# Patient Record
Sex: Male | Born: 1980 | Race: White | Hispanic: No | Marital: Married | Smoking: Never smoker
Health system: Southern US, Community
[De-identification: ages and names within clinical notes are randomized; demographics above are authoritative.]

## PROBLEM LIST (undated history)

## (undated) DIAGNOSIS — R002 Palpitations: Secondary | ICD-10-CM

## (undated) DIAGNOSIS — I10 Essential (primary) hypertension: Secondary | ICD-10-CM

## (undated) DIAGNOSIS — R42 Dizziness and giddiness: Secondary | ICD-10-CM

## (undated) HISTORY — PX: OTHER SURGICAL HISTORY: SHX169

## (undated) HISTORY — DX: Dizziness and giddiness: R42

## (undated) HISTORY — DX: Palpitations: R00.2

---
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
## 2009-07-29 ENCOUNTER — Emergency Department (HOSPITAL_COMMUNITY): Admission: EM | Admit: 2009-07-29 | Discharge: 2009-07-29 | Payer: Self-pay | Admitting: Emergency Medicine

## 2009-10-05 ENCOUNTER — Emergency Department (HOSPITAL_COMMUNITY): Admission: EM | Admit: 2009-10-05 | Discharge: 2009-10-05 | Payer: Self-pay | Admitting: Emergency Medicine

## 2010-07-14 LAB — POCT I-STAT, CHEM 8
BUN: 12 mg/dL (ref 6–23)
Calcium, Ion: 1.09 mmol/L — ABNORMAL LOW (ref 1.12–1.32)
Chloride: 106 mEq/L (ref 96–112)
Creatinine, Ser: 1 mg/dL (ref 0.4–1.5)
Glucose, Bld: 95 mg/dL (ref 70–99)
HCT: 44 % (ref 39.0–52.0)
Hemoglobin: 15 g/dL (ref 13.0–17.0)
Potassium: 4 mEq/L (ref 3.5–5.1)
Sodium: 139 mEq/L (ref 135–145)
TCO2: 24 mmol/L (ref 0–100)

## 2011-09-08 ENCOUNTER — Encounter: Payer: Self-pay | Admitting: *Deleted

## 2015-06-11 ENCOUNTER — Emergency Department (HOSPITAL_COMMUNITY)
Admission: EM | Admit: 2015-06-11 | Discharge: 2015-06-11 | Disposition: A | Discharge status: Home / Self Care | Payer: 59 | Attending: Emergency Medicine | Admitting: Emergency Medicine

## 2015-06-11 ENCOUNTER — Encounter (HOSPITAL_COMMUNITY): Payer: Self-pay | Admitting: Emergency Medicine

## 2015-06-11 ENCOUNTER — Emergency Department (HOSPITAL_COMMUNITY): Payer: 59

## 2015-06-11 DIAGNOSIS — R630 Anorexia: Secondary | ICD-10-CM | POA: Diagnosis not present

## 2015-06-11 DIAGNOSIS — R1032 Left lower quadrant pain: Secondary | ICD-10-CM | POA: Diagnosis present

## 2015-06-11 DIAGNOSIS — N200 Calculus of kidney: Secondary | ICD-10-CM

## 2015-06-11 LAB — COMPREHENSIVE METABOLIC PANEL
ALT: 29 U/L (ref 17–63)
AST: 25 U/L (ref 15–41)
Albumin: 4 g/dL (ref 3.5–5.0)
Alkaline Phosphatase: 69 U/L (ref 38–126)
Anion gap: 16 — ABNORMAL HIGH (ref 5–15)
BUN: 15 mg/dL (ref 6–20)
CO2: 21 mmol/L — ABNORMAL LOW (ref 22–32)
Calcium: 9.8 mg/dL (ref 8.9–10.3)
Chloride: 106 mmol/L (ref 101–111)
Creatinine, Ser: 1.12 mg/dL (ref 0.61–1.24)
GFR calc Af Amer: >60 mL/min (ref >60)
GFR calc non Af Amer: >60 mL/min (ref >60)
Glucose, Bld: 109 mg/dL — ABNORMAL HIGH (ref 65–99)
Potassium: 4.1 mmol/L (ref 3.5–5.1)
Sodium: 143 mmol/L (ref 135–145)
Total Bilirubin: 0.7 mg/dL (ref 0.3–1.2)
Total Protein: 7 g/dL (ref 6.5–8.1)

## 2015-06-11 LAB — URINALYSIS, ROUTINE W REFLEX MICROSCOPIC
Bilirubin Urine: NEGATIVE
GLUCOSE, UA: NEGATIVE mg/dL
Hgb urine dipstick: NEGATIVE
Ketones, ur: NEGATIVE mg/dL
LEUKOCYTES UA: NEGATIVE
Nitrite: NEGATIVE
PH: 5 (ref 5.0–8.0)
PROTEIN: NEGATIVE mg/dL
Specific Gravity, Urine: 1.021 (ref 1.005–1.030)

## 2015-06-11 LAB — CBC
HEMATOCRIT: 44.5 % (ref 39.0–52.0)
Hemoglobin: 15 g/dL (ref 13.0–17.0)
MCH: 30.6 pg (ref 26.0–34.0)
MCHC: 33.7 g/dL (ref 30.0–36.0)
MCV: 90.8 fL (ref 78.0–100.0)
PLATELETS: 277 10*3/uL (ref 150–400)
RBC: 4.9 MIL/uL (ref 4.22–5.81)
RDW: 13.7 % (ref 11.5–15.5)
WBC: 10.2 10*3/uL (ref 4.0–10.5)

## 2015-06-11 MED ORDER — ONDANSETRON HCL 4 MG/2ML IJ SOLN
4.0000 mg | Freq: Once | INTRAMUSCULAR | Status: AC
  Administered 2015-06-11: 4 mg via INTRAVENOUS
  Filled 2015-06-11: qty 2

## 2015-06-11 MED ORDER — HYDROCODONE-ACETAMINOPHEN 5-325 MG PO TABS: 1.0000 | ORAL_TABLET | ORAL | Status: DC | PRN

## 2015-06-11 MED ORDER — KETOROLAC TROMETHAMINE 30 MG/ML IJ SOLN
30.0000 mg | Freq: Once | INTRAMUSCULAR | Status: AC
  Administered 2015-06-11: 30 mg via INTRAVENOUS
  Filled 2015-06-11: qty 1

## 2015-06-11 MED ORDER — MORPHINE SULFATE (PF) 4 MG/ML IV SOLN
6.0000 mg | Freq: Once | INTRAVENOUS | Status: AC
  Administered 2015-06-11: 6 mg via INTRAVENOUS
  Filled 2015-06-11: qty 2

## 2015-06-11 MED ORDER — OXYCODONE-ACETAMINOPHEN 5-325 MG PO TABS
1.0000 | ORAL_TABLET | Freq: Once | ORAL | Status: AC
  Administered 2015-06-11: 1 via ORAL

## 2015-06-11 MED ORDER — OXYCODONE-ACETAMINOPHEN 5-325 MG PO TABS
ORAL_TABLET | ORAL | Status: AC
  Filled 2015-06-11: qty 1

## 2015-06-11 NOTE — ED Provider Notes (Signed)
CSN: [id number]     Arrival date & time 06/11/15  0522 History   First MD Initiated Contact with Patient 06/11/15 [phone]     Chief Complaint  Patient presents with  . Abdominal Pain  . Back Pain  . Groin Pain     (Consider location/radiation/quality/duration/timing/severity/associated sxs/prior Treatment) HPI Comments: 35 year old male with no significant medical history presents with left lower abdominal and groin pain since this morning. Radiates the testicle. No history of similar. No family history of kidney stones. Decreased urine output no hematuria noted. No fevers or chills. Decreased appetite and no vomiting. Symptoms intermittent.  Patient is a 35 y.o. male presenting with abdominal pain, back pain, and groin pain. The history is provided by the patient.  Abdominal Pain Associated symptoms: nausea   Associated symptoms: no chest pain, no chills, no dysuria, no fever, no shortness of breath and no vomiting   Back Pain Associated symptoms: abdominal pain   Associated symptoms: no chest pain, no dysuria, no fever and no headaches   Groin Pain Associated symptoms include abdominal pain. Pertinent negatives include no chest pain, no headaches and no shortness of breath.    Past Medical History  Diagnosis Date  . Palpitations   . Dizziness    Past Surgical History  Procedure Laterality Date  . None     Family History  Problem Relation Age of Onset  . Coronary artery disease    . Hypertension     Social History  Substance Use Topics  . Smoking status: Never Smoker   . Smokeless tobacco: None  . Alcohol Use: Yes    Review of Systems  Constitutional: Negative for fever and chills.  HENT: Negative for congestion.   Eyes: Negative for visual disturbance.  Respiratory: Negative for shortness of breath.   Cardiovascular: Negative for chest pain.  Gastrointestinal: Positive for nausea and abdominal pain. Negative for vomiting.  Genitourinary: Positive for flank pain.  Negative for dysuria.  Musculoskeletal: Positive for back pain. Negative for neck pain and neck stiffness.  Skin: Negative for rash.  Neurological: Negative for light-headedness and headaches.      Allergies  Review of patient's allergies indicates no known allergies.  Home Medications   Prior to Admission medications   Medication Sig Start Date End Date Taking? Authorizing Provider  HYDROcodone-acetaminophen (NORCO) 5-325 MG tablet Take 1-2 tablets by mouth every 4 (four) hours as needed. 06/11/15   Blane Ohara, MD   BP 141/98 mmHg  Pulse 78  Temp(Src) 98 F (36.7 C) (Oral)  Resp 16  SpO2 100% Physical Exam  Constitutional: He is oriented to person, place, and time. He appears well-developed and well-nourished.  HENT:  Head: Normocephalic and atraumatic.  Eyes: Conjunctivae are normal. Right eye exhibits no discharge. Left eye exhibits no discharge.  Neck: Normal range of motion. Neck supple. No tracheal deviation present.  Cardiovascular: Normal rate and regular rhythm.   Pulmonary/Chest: Effort normal and breath sounds normal.  Abdominal: Soft. He exhibits no distension. There is tenderness (mild left lower quadrant uncomfortable). There is no guarding.  Genitourinary:  No testicular swelling tenderness or sign of infection no hernia palpated  Musculoskeletal: He exhibits no edema.  Neurological: He is alert and oriented to person, place, and time.  Skin: Skin is warm. No rash noted.  Psychiatric: He has a normal mood and affect.  Nursing note and vitals reviewed.   ED Course  Procedures (including critical care time) Emergency Focused Ultrasound Exam Limited retroperitoneal ultrasound of kidneys  Performed and interpreted by Dr. Jodi Mourning Indication: flank pain Focused abdominal ultrasound with both kidneys imaged in transverse and longitudinal planes in real-time. Interpretation: mild left hydronephrosis visualized.   Images archived electronically  CPT Code:  [phone] (limited retroperitoneal)  Labs Review Labs Reviewed  COMPREHENSIVE METABOLIC PANEL - Abnormal; Notable for the following:    CO2 21 (*)    Glucose, Bld 109 (*)    Anion gap 16 (*)    All other components within normal limits  URINALYSIS, ROUTINE W REFLEX MICROSCOPIC (NOT AT [facility]) - Abnormal; Notable for the following:    APPearance CLOUDY (*)    All other components within normal limits  URINE CULTURE  CBC    Imaging Review Ct Renal Stone Study  06/11/2015  CLINICAL DATA:  Left-sided flank pain for several hours EXAM: CT ABDOMEN AND PELVIS WITHOUT CONTRAST TECHNIQUE: Multidetector CT imaging of the abdomen and pelvis was performed following the standard protocol without IV contrast. COMPARISON:  None. FINDINGS: Lung bases are free of acute infiltrate or sizable effusion. The liver, gallbladder, spleen, adrenal glands and pancreas are normal in their CT appearance. The right kidney is well visualized and within normal limits. The left kidney demonstrates evidence of mild hydronephrosis and hydroureter extending to the left UVJ. At this point there is a 2 mm partially obstructing stone identified. The bladder is well distended. No pelvic mass lesion is seen. The appendix is within normal limits. No acute bony abnormality is noted. IMPRESSION: Left UVJ stone with partial obstruction of the left collecting system and ureter. Electronically Signed   By: Alcide Clever M.D.   On: 06/11/2015 08:13   I have personally reviewed and evaluated these images and lab results as part of my medical decision-making.   EKG Interpretation None      MDM   Final diagnoses:  Kidney stone on left side   Patient presents with clinical concern for kidney stone. Pain not improved after 2 doses of medicine. With first time kidney stone plan for CT scan to confirm details. No signs of testicular pathology on exam.  Patient's pain controlled on reassessment. CT scan revealed small kidney stone discussed  outpatient follow-up and reasons to return Results and differential diagnosis were discussed with the patient/parent/guardian. Xrays were independently reviewed by myself.  Close follow up outpatient was discussed, comfortable with the plan.   Medications  oxyCODONE-acetaminophen (PERCOCET/ROXICET) 5-325 MG per tablet 1 tablet (1 tablet Oral Given 06/11/15 0539)  morphine 4 MG/ML injection 6 mg (6 mg Intravenous Given 06/11/15 0709)  ondansetron (ZOFRAN) injection 4 mg (4 mg Intravenous Given 06/11/15 0710)  ketorolac (TORADOL) 30 MG/ML injection 30 mg (30 mg Intravenous Given 06/11/15 0744)    Filed Vitals:   06/11/15 0531 06/11/15 0630 06/11/15 0700 06/11/15 0730  BP: 144/102 146/97 138/93 141/98  Pulse: 89 77 68 78  Temp: 98 F (36.7 C)     TempSrc: Oral     Resp: 16     SpO2: 96% 99% 98% 100%    Final diagnoses:  Kidney stone on left side       Blane Ohara, MD 06/11/15 0900

## 2015-06-11 NOTE — ED Notes (Signed)
Pt. Reports low abdominal pain , left groin and low back pain onset this morning , denies injury , no emesis or diarrhea, denies hematuria or dysuria .

## 2015-06-11 NOTE — ED Notes (Signed)
Pt states that the last time he urinated was shortly after arriving to the ED today.

## 2015-06-11 NOTE — ED Notes (Signed)
Pt ambulated to and from bathroom without difficulty.

## 2015-06-11 NOTE — Discharge Instructions (Signed)
Take ibuprofen and Tylenol as needed for pain. For severe pain take norco or vicodin however realize they have the potential for addiction and it can make you sleepy and has tylenol in it.  No operating machinery while taking.  If you were given medicines take as directed.  If you are on coumadin or contraceptives realize their levels and effectiveness is altered by many different medicines.  If you have any reaction (rash, tongues swelling, other) to the medicines stop taking and see a physician.    If your blood pressure was elevated in the ER make sure you follow up for management with a primary doctor or return for chest pain, shortness of breath or stroke symptoms.  Please follow up as directed and return to the ER or see a physician for new or worsening symptoms.  Thank you. Filed Vitals:   06/11/15 0531 06/11/15 0630 06/11/15 0700 06/11/15 0730  BP: 144/102 146/97 138/93 141/98  Pulse: 89 77 68 78  Temp: 98 F (36.7 C)     TempSrc: Oral     Resp: 16     SpO2: 96% 99% 98% 100%   Strain urine.

## 2015-06-12 LAB — URINE CULTURE

## 2015-11-24 ENCOUNTER — Encounter (HOSPITAL_COMMUNITY): Payer: Self-pay | Admitting: Emergency Medicine

## 2015-11-24 ENCOUNTER — Emergency Department (HOSPITAL_COMMUNITY)
Admission: EM | Admit: 2015-11-24 | Discharge: 2015-11-25 | Disposition: A | Discharge status: Home / Self Care | Payer: 59 | Attending: Emergency Medicine | Admitting: Emergency Medicine

## 2015-11-24 ENCOUNTER — Emergency Department (HOSPITAL_COMMUNITY): Payer: 59

## 2015-11-24 DIAGNOSIS — W57XXXA Bitten or stung by nonvenomous insect and other nonvenomous arthropods, initial encounter: Secondary | ICD-10-CM | POA: Diagnosis not present

## 2015-11-24 DIAGNOSIS — Y999 Unspecified external cause status: Secondary | ICD-10-CM | POA: Insufficient documentation

## 2015-11-24 DIAGNOSIS — S40862A Insect bite (nonvenomous) of left upper arm, initial encounter: Secondary | ICD-10-CM | POA: Insufficient documentation

## 2015-11-24 DIAGNOSIS — R0789 Other chest pain: Secondary | ICD-10-CM | POA: Diagnosis not present

## 2015-11-24 DIAGNOSIS — Y9289 Other specified places as the place of occurrence of the external cause: Secondary | ICD-10-CM | POA: Diagnosis not present

## 2015-11-24 DIAGNOSIS — M62830 Muscle spasm of back: Secondary | ICD-10-CM | POA: Insufficient documentation

## 2015-11-24 DIAGNOSIS — Y939 Activity, unspecified: Secondary | ICD-10-CM | POA: Insufficient documentation

## 2015-11-24 LAB — BASIC METABOLIC PANEL
ANION GAP: 6 (ref 5–15)
BUN: 12 mg/dL (ref 6–20)
CALCIUM: 9.5 mg/dL (ref 8.9–10.3)
CHLORIDE: 110 mmol/L (ref 101–111)
CO2: 25 mmol/L (ref 22–32)
CREATININE: 1.17 mg/dL (ref 0.61–1.24)
GFR calc non Af Amer: >60 mL/min (ref >60)
Glucose, Bld: 91 mg/dL (ref 65–99)
Potassium: 3.7 mmol/L (ref 3.5–5.1)
SODIUM: 141 mmol/L (ref 135–145)

## 2015-11-24 LAB — CBC
HCT: 44.7 % (ref 39.0–52.0)
HEMOGLOBIN: 14.7 g/dL (ref 13.0–17.0)
MCH: 29.9 pg (ref 26.0–34.0)
MCHC: 32.9 g/dL (ref 30.0–36.0)
MCV: 91 fL (ref 78.0–100.0)
PLATELETS: 272 10*3/uL (ref 150–400)
RBC: 4.91 MIL/uL (ref 4.22–5.81)
RDW: 13.3 % (ref 11.5–15.5)
WBC: 10.4 10*3/uL (ref 4.0–10.5)

## 2015-11-24 LAB — I-STAT TROPONIN, ED: TROPONIN I, POC: 0.01 ng/mL (ref 0.00–0.08)

## 2015-11-24 NOTE — ED Triage Notes (Signed)
Pt. reports central chest pain onset today worse with deep inspiration , denies SOB /bnausea or diaphoresis , pt. added insect bites at left arm while working at yard today .

## 2015-11-25 MED ORDER — NAPROXEN 500 MG PO TABS: 500.0000 mg | ORAL_TABLET | Freq: Two times a day (BID) | ORAL | 0 refills | Status: DC

## 2015-11-25 MED ORDER — CYCLOBENZAPRINE HCL 10 MG PO TABS: 5.0000 mg | ORAL_TABLET | Freq: Two times a day (BID) | ORAL | 0 refills | Status: DC | PRN

## 2015-11-25 MED ORDER — CYCLOBENZAPRINE HCL 10 MG PO TABS
5.0000 mg | ORAL_TABLET | Freq: Once | ORAL | Status: AC
  Administered 2015-11-25: 5 mg via ORAL
  Filled 2015-11-25: qty 1

## 2015-11-25 MED ORDER — KETOROLAC TROMETHAMINE 60 MG/2ML IM SOLN
60.0000 mg | Freq: Once | INTRAMUSCULAR | Status: AC
  Administered 2015-11-25: 60 mg via INTRAMUSCULAR
  Filled 2015-11-25: qty 2

## 2015-11-25 NOTE — ED Provider Notes (Signed)
TIME SEEN: 1: 15 am  CHIEF COMPLAINT: Chest pain and insect bite  HPI: Patient was working on applying mulch and moved a large TV today. While working on the mulch he sustained 5 insect bites to his left arm. A few hours after he developed some pain over his left breast which worsened with deep inspiration. He took his blood pressure and noted it was high at 150/78, his BP is usually normal. He also has worsned pain when pushing himself up to stand up and with certain movements of his arms and back.  ROS: See HPI Constitutional: no fever  Eyes: no drainage  ENT: no runny nose   Cardiovascular:  chest pain  Resp: no SOB  GI: no vomiting GU: no dysuria Integumentary: no rash  Allergy: no hives  Musculoskeletal: no leg swelling  Neurological: no slurred speech ROS otherwise negative  PAST MEDICAL HISTORY/PAST SURGICAL HISTORY:  Past Medical History:  Diagnosis Date  . Dizziness   . Palpitations     MEDICATIONS:  Prior to Admission medications   Medication Sig Start Date End Date Taking? Authorizing Provider  HYDROcodone-acetaminophen (NORCO) 5-325 MG tablet Take 1-2 tablets by mouth every 4 (four) hours as needed. Patient not taking: Reported on 11/25/2015 06/11/15   Blane Ohara, MD    ALLERGIES:  No Known Allergies  SOCIAL HISTORY:  Social History  Substance Use Topics  . Smoking status: Never Smoker  . Smokeless tobacco: Not on file  . Alcohol use Yes    FAMILY HISTORY: Family History  Problem Relation Age of Onset  . Coronary artery disease    . Hypertension      EXAM: BP 139/91   Pulse 65   Temp 98.3 F (36.8 C) (Oral)   Resp 13   SpO2 98%  CONSTITUTIONAL: Alert and oriented and responds appropriately to questions. Well-appearing; well-nourished HEAD: Normocephalic EYES: Conjunctivae clear, PERRL ENT: normal nose; no rhinorrhea; moist mucous membranes NECK: Supple, no meningismus, no LAD  CARD: RRR; S1 and S2 appreciated; no murmurs, no clicks, no  rubs, no gallops RESP: Normal chest excursion without splinting or tachypnea; breath sounds clear and equal bilaterally; no wheezes, no rhonchi, no rales, no hypoxia or respiratory distress, speaking full sentences ABD/GI: Normal bowel sounds; non-distended; soft, non-tender, no rebound, no guarding, no peritoneal signs BACK:  The back appears normal and is non-tender to palpation, there is no CVA tenderness EXT: Normal ROM in all joints; non-tender to palpation; no edema; normal capillary refill; no cyanosis, no calf tenderness or swelling    The patient developed worsened pectoral and upper back muscle cramps when doing ROM to the left arm and shoulder against resistance requiring him to stop and stretch during the exam. Each time I attempted resistance ROM he developed cramping to his chest and back. SKIN: Normal color for age and race; warm; no rash, small non infected insect bites to left arm. NEURO: Moves all extremities equally, sensation to light touch intact diffusely, cranial nerves II through XII intact PSYCH: The patient's mood and manner are appropriate. Grooming and personal hygiene are appropriate.  MEDICAL DECISION MAKING: The patients pain is reproducible and exacerbated by range of motion, even more so against resistance. Suspicion  For ACS or for PE is very low. Normal chest xray, no wheezing, negative Troponin, BP has returned to baseline. Discussed diagnosis and plan with patient and family members.  Medications  ketorolac (TORADOL) injection 60 mg (not administered)  cyclobenzaprine (FLEXERIL) tablet 5 mg (not administered)  ED PROGRESS: rx: Naprosyn and Flexeril. Discussed return precautions and re-evaluation recommended by PCP within the next 2-3 days.  Dx: 1.  muscle spasms  2.  chest wall pain 3.   Insect bites      Marlon Pel, PA-C 11/25/15 0123    Shon Baton, MD 11/25/15 [phone]

## 2015-11-25 NOTE — ED Notes (Signed)
Patient Alert and oriented X4. Stable and ambulatory. Patient verbalized understanding of the discharge instructions.  Patient belongings were taken by the patient.  

## 2016-04-30 ENCOUNTER — Ambulatory Visit: Payer: 59 | Admitting: Cardiology

## 2016-05-24 NOTE — Progress Notes (Deleted)
Cardiology Office Note    Date:  05/24/2016   ID:  Charles Hess, DOB 1980/07/15, MRN [id number]  PCP:  No PCP Per Patient  Cardiologist:  Luciana Cammarata [place]Jordan, MD    History of Present Illness:  Charles Hess is a 36 y.o. male ***    Past Medical History:  Diagnosis Date  . Dizziness   . Palpitations     Past Surgical History:  Procedure Laterality Date  . none      Current Medications: Outpatient Medications Prior to Visit  Medication Sig Dispense Refill  . cyclobenzaprine (FLEXERIL) 10 MG tablet Take 0.5-1 tablets (5-10 mg total) by mouth 2 (two) times daily as needed. 20 tablet 0  . HYDROcodone-acetaminophen (NORCO) 5-325 MG tablet Take 1-2 tablets by mouth every 4 (four) hours as needed. (Patient not taking: Reported on 11/25/2015) 15 tablet 0  . naproxen (NAPROSYN) 500 MG tablet Take 1 tablet (500 mg total) by mouth 2 (two) times daily. 30 tablet 0   No facility-administered medications prior to visit.      Allergies:   Patient has no known allergies.   Social History   Social History  . Marital status: Single    Spouse name: N/A  . Number of children: 1  . Years of education: N/A   Occupational History  . internet security    Social History Main Topics  . Smoking status: Never Smoker  . Smokeless tobacco: Not on file  . Alcohol use Yes  . Drug use: No  . Sexual activity: Not on file   Other Topics Concern  . Not on file   Social History Narrative  . No narrative on file     Family History:  The patient's ***family history is not on file.   ROS:   Please see the history of present illness.    ROS All other systems reviewed and are negative.   PHYSICAL EXAM:   VS:  There were no vitals taken for this visit.   GEN: Well nourished, well developed, in no acute distress  HEENT: normal  Neck: no JVD, carotid bruits, or masses Cardiac: ***RRR; no murmurs, rubs, or gallops,no edema  Respiratory:  clear to auscultation bilaterally, normal  work of breathing GI: soft, nontender, nondistended, + BS MS: no deformity or atrophy  Skin: warm and dry, no rash Neuro:  Alert and Oriented x 3, Strength and sensation are intact Psych: euthymic mood, full affect  Wt Readings from Last 3 Encounters:  No data found for Wt      Studies/Labs Reviewed:   EKG:  EKG is*** ordered today.  The ekg ordered today demonstrates ***  Recent Labs: 06/11/2015: ALT 29 11/24/2015: BUN 12; Creatinine, Ser 1.17; Hemoglobin 14.7; Platelets 272; Potassium 3.7; Sodium 141   Lipid Panel No results found for: CHOL, TRIG, HDL, CHOLHDL, VLDL, LDLCALC, LDLDIRECT  Additional studies/ records that were reviewed today include:  Dated 10/23/09 cholesterol 162, triglycerides 125, LDL 96, HDL 41  ASSESSMENT:    No diagnosis found.   PLAN:  In order of problems listed above:  1. ***    Medication Adjustments/Labs and Tests Ordered: Current medicines are reviewed at length with the patient today.  Concerns regarding medicines are outlined above.  Medication changes, Labs and Tests ordered today are listed in the Patient Instructions below. There are no Patient Instructions on file for this visit.   Signed, Caralynn Gelber [place], MD  05/24/2016 8:19 AM    [facility] [address], [place], [place], [address] [phone]

## 2016-05-25 ENCOUNTER — Ambulatory Visit: Payer: 59 | Admitting: Cardiology

## 2016-07-27 ENCOUNTER — Encounter: Payer: Self-pay | Admitting: Physician Assistant

## 2016-07-27 ENCOUNTER — Ambulatory Visit (INDEPENDENT_AMBULATORY_CARE_PROVIDER_SITE_OTHER): Payer: 59 | Admitting: Physician Assistant

## 2016-07-27 VITALS — BP 150/96 | HR 69 | Ht 69.0 in | Wt 241.4 lb

## 2016-07-27 DIAGNOSIS — I10 Essential (primary) hypertension: Secondary | ICD-10-CM

## 2016-07-27 DIAGNOSIS — Z7189 Other specified counseling: Secondary | ICD-10-CM | POA: Diagnosis not present

## 2016-07-27 MED ORDER — METOPROLOL TARTRATE 25 MG PO TABS: 12.5000 mg | ORAL_TABLET | Freq: Two times a day (BID) | ORAL | 3 refills | Status: DC

## 2016-07-27 NOTE — Progress Notes (Signed)
Cardiology Office Note    Date:  07/28/2016   ID:  Charles Hess, DOB 1981-01-05, MRN [id number]  PCP:  No PCP Per Patient  Cardiologist:  (remotely seen by Dr. Swaziland, wish to reestablish with Dr. Swaziland)  Chief Complaint  Patient presents with  . New Patient (Initial Visit)    high blood pressure    History of Present Illness:  Charles Hess is a 36 y.o. male with PMH of dizziness and palpitation. According to the patient, at some time in the past, Dr. Swaziland has placed a heart monitor on him for palpitation, however it did not catch anything. I am unable to locate such record in our system. He has otherwise been doing quite well. He says his wife used to work with Dr. Swaziland. He denies any exertional type of symptom. He is a Chartered loss adjuster, however does do strenuous activity with cleaning the backyard or chopping woods. He denies any recent exertional type of symptom. However since December and January, he has been having elevated blood pressure. His baseline blood pressure has been read in the 140s to 150s range, but with anxiety episode his systolic blood pressure can go up to 180s. Today his blood pressure is actually in the 150s, I did manually rechecked later and his blood pressure still in the 150s. He denies any headache or blurred vision, when his blood pressure is high, he would have stiffness in the neck. Otherwise he denies any chest pain, shortness of breath, or dizziness. Given his anxiety issue, I will start him on a low-dose metoprolol tartrate 12.5 mg twice a day. I have asked him to check his blood pressure twice a day and send me his BP and HR through mychart after a week. I have discussed the case with Dr. Swaziland who is agreeable to reestablish care with this patient. Even though Dr. Tresa Endo was the DOD, however patient wished to establish with Dr. Swaziland instead.     Past Medical History:  Diagnosis Date  . Dizziness   . Palpitations     Past  Surgical History:  Procedure Laterality Date  . none      Current Medications: Outpatient Medications Prior to Visit  Medication Sig Dispense Refill  . cyclobenzaprine (FLEXERIL) 10 MG tablet Take 0.5-1 tablets (5-10 mg total) by mouth 2 (two) times daily as needed. (Patient not taking: Reported on 07/27/2016) 20 tablet 0  . HYDROcodone-acetaminophen (NORCO) 5-325 MG tablet Take 1-2 tablets by mouth every 4 (four) hours as needed. (Patient not taking: Reported on 11/25/2015) 15 tablet 0  . naproxen (NAPROSYN) 500 MG tablet Take 1 tablet (500 mg total) by mouth 2 (two) times daily. (Patient not taking: Reported on 07/27/2016) 30 tablet 0   No facility-administered medications prior to visit.      Allergies:   Patient has no known allergies.   Social History   Social History  . Marital status: Single    Spouse name: N/A  . Number of children: 1  . Years of education: N/A   Occupational History  . internet security    Social History Main Topics  . Smoking status: Never Smoker  . Smokeless tobacco: Never Used  . Alcohol use Yes  . Drug use: No  . Sexual activity: Not Asked   Other Topics Concern  . None   Social History Narrative  . None     Family History:  The patient's family history includes Heart disease in his father; Hypertension in  his father.   ROS:   Please see the history of present illness.    ROS All other systems reviewed and are negative.   PHYSICAL EXAM:   VS:  BP (!) 150/96   Pulse 69   Ht  (1.753 m)   Wt 241 lb 6.4 oz (109.5 kg)   BMI 35.65 kg/m    GEN: Well nourished, well developed, in no acute distress  HEENT: normal  Neck: no JVD, carotid bruits, or masses Cardiac: RRR; no murmurs, rubs, or gallops,no edema  Respiratory:  clear to auscultation bilaterally, normal work of breathing GI: soft, nontender, nondistended, + BS MS: no deformity or atrophy  Skin: warm and dry, no rash Neuro:  Alert and Oriented x 3, Strength and sensation are  intact Psych: euthymic mood, full affect  Wt Readings from Last 3 Encounters:  07/27/16 241 lb 6.4 oz (109.5 kg)      Studies/Labs Reviewed:   EKG:  EKG is ordered today.  The ekg ordered today demonstrates Sinus rhythm without significant ST-T wave changes  Recent Labs: 11/24/2015: BUN 12; Creatinine, Ser 1.17; Hemoglobin 14.7; Platelets 272; Potassium 3.7; Sodium 141   Lipid Panel No results found for: CHOL, TRIG, HDL, CHOLHDL, VLDL, LDLCALC, LDLDIRECT  Additional studies/ records that were reviewed today include:   Previous EKG in 2017    ASSESSMENT:    1. Essential hypertension   2. Encounter for cardiac risk counseling      PLAN:  In order of problems listed above:  1. Hypertension: He has some underlying anxiety issues when his blood pressure goes up, however since January has been running in the 140s to 150s range. He did bring his home blood pressure machine today, however he did not bring the cuff that attaches to it. I will start on metoprolol 12.5 mg twice a day which should help with anxiety issue and also blood pressure as well. He has been instructed to monitor his blood pressure on a daily basis and record a blood pressure diary. He will send me his blood pressure diary after a week through my chart. I will adjust his medication further before his next visit with Dr. Swaziland.    Medication Adjustments/Labs and Tests Ordered: Current medicines are reviewed at length with the patient today.  Concerns regarding medicines are outlined above.  Medication changes, Labs and Tests ordered today are listed in the Patient Instructions below. Patient Instructions  Azalee Course, PA has requested that you regularly monitor and record your blood pressure (BP) readings at home.  -- please check your BP no more than twice daily, after you have been sitting/resting for 5-10 minutes, at least 1 hour after taking your BP medications -- in 1 week, please report your BP readings to [facility]  via [facility] Morrow recommends that you schedule a follow-up appointment in 2-3 months with Dr Swaziland.    Ramond Dial, [place]  07/28/2016 6:44 AM    [facility] [address] [place], [place], [place]  [address] Phone: [phone]; Fax: [phone]

## 2016-07-27 NOTE — Patient Instructions (Signed)
Charles Hess, Charles Hess has requested that you regularly monitor and record your blood pressure (BP) readings at home.  -- please check your BP no more than twice daily, after you have been sitting/resting for 5-10 minutes, at least 1 hour after taking your BP medications -- in 1 week, please report your BP readings to [facility] via [facility]omas[facility] Morrow recommends that you schedule a follow-up appointment in 2-3 months with Dr Swaziland.

## 2016-07-28 ENCOUNTER — Encounter: Payer: Self-pay | Admitting: Physician Assistant

## 2016-08-05 ENCOUNTER — Encounter: Payer: Self-pay | Admitting: Physician Assistant

## 2016-08-05 ENCOUNTER — Telehealth: Payer: Self-pay | Admitting: Physician Assistant

## 2016-08-05 NOTE — Telephone Encounter (Signed)
Patient did sent me a 1 week BP recording, although his SBP on 4/3 was in 140s, since then SBP has largely been in 120s with HR 70-80s. Will continue on current dose of metoprolol. He denies any dizziness or fatigue associated with it.   Ramond Dial PA Pager: [phone]

## 2016-08-21 ENCOUNTER — Encounter (HOSPITAL_COMMUNITY): Payer: Self-pay | Admitting: Emergency Medicine

## 2016-08-21 ENCOUNTER — Emergency Department (HOSPITAL_COMMUNITY)
Admission: EM | Admit: 2016-08-21 | Discharge: 2016-08-21 | Disposition: A | Discharge status: Home / Self Care | Payer: 59 | Attending: Emergency Medicine | Admitting: Emergency Medicine

## 2016-08-21 ENCOUNTER — Emergency Department (HOSPITAL_COMMUNITY): Payer: 59

## 2016-08-21 ENCOUNTER — Encounter: Payer: Self-pay | Admitting: Physician Assistant

## 2016-08-21 DIAGNOSIS — R1084 Generalized abdominal pain: Secondary | ICD-10-CM

## 2016-08-21 DIAGNOSIS — I10 Essential (primary) hypertension: Secondary | ICD-10-CM | POA: Diagnosis not present

## 2016-08-21 DIAGNOSIS — R109 Unspecified abdominal pain: Secondary | ICD-10-CM | POA: Diagnosis present

## 2016-08-21 HISTORY — DX: Essential (primary) hypertension: I10

## 2016-08-21 LAB — CBC WITH DIFFERENTIAL/PLATELET
BASOS PCT: 0 %
Basophils Absolute: 0 10*3/uL (ref 0.0–0.1)
EOS ABS: 0.1 10*3/uL (ref 0.0–0.7)
EOS PCT: 1 %
HCT: 47.1 % (ref 39.0–52.0)
Hemoglobin: 15.9 g/dL (ref 13.0–17.0)
LYMPHS ABS: 2.9 10*3/uL (ref 0.7–4.0)
Lymphocytes Relative: 30 %
MCH: 29.9 pg (ref 26.0–34.0)
MCHC: 33.8 g/dL (ref 30.0–36.0)
MCV: 88.5 fL (ref 78.0–100.0)
Monocytes Absolute: 0.6 10*3/uL (ref 0.1–1.0)
Monocytes Relative: 6 %
Neutro Abs: 5.9 10*3/uL (ref 1.7–7.7)
Neutrophils Relative %: 63 %
PLATELETS: 284 10*3/uL (ref 150–400)
RBC: 5.32 MIL/uL (ref 4.22–5.81)
RDW: 13.5 % (ref 11.5–15.5)
WBC: 9.5 10*3/uL (ref 4.0–10.5)

## 2016-08-21 LAB — LIPASE, BLOOD: LIPASE: 26 U/L (ref 11–51)

## 2016-08-21 LAB — COMPREHENSIVE METABOLIC PANEL
ALK PHOS: 70 U/L (ref 38–126)
ALT: 29 U/L (ref 17–63)
AST: 26 U/L (ref 15–41)
Albumin: 4.5 g/dL (ref 3.5–5.0)
Anion gap: 10 (ref 5–15)
BUN: 9 mg/dL (ref 6–20)
CALCIUM: 9.4 mg/dL (ref 8.9–10.3)
CO2: 23 mmol/L (ref 22–32)
CREATININE: 1.05 mg/dL (ref 0.61–1.24)
Chloride: 103 mmol/L (ref 101–111)
Glucose, Bld: 91 mg/dL (ref 65–99)
Potassium: 4 mmol/L (ref 3.5–5.1)
Sodium: 136 mmol/L (ref 135–145)
TOTAL PROTEIN: 7.6 g/dL (ref 6.5–8.1)
Total Bilirubin: 0.7 mg/dL (ref 0.3–1.2)

## 2016-08-21 MED ORDER — IOPAMIDOL (ISOVUE-300) INJECTION 61%
INTRAVENOUS | Status: AC
  Administered 2016-08-21: 100 mL
  Filled 2016-08-21: qty 100

## 2016-08-21 MED ORDER — GI COCKTAIL ~~LOC~~
30.0000 mL | Freq: Once | ORAL | Status: AC
  Administered 2016-08-21: 30 mL via ORAL
  Filled 2016-08-21: qty 30

## 2016-08-21 MED ORDER — SODIUM CHLORIDE 0.9 % IV SOLN
INTRAVENOUS | Status: DC
  Administered 2016-08-21: 14:00:00 via INTRAVENOUS

## 2016-08-21 MED ORDER — TRAMADOL HCL 50 MG PO TABS: 50.0000 mg | ORAL_TABLET | Freq: Four times a day (QID) | ORAL | 0 refills | Status: DC | PRN

## 2016-08-21 MED ORDER — FAMOTIDINE 10 MG PO TABS: 10.0000 mg | ORAL_TABLET | Freq: Two times a day (BID) | ORAL | 0 refills | Status: DC

## 2016-08-21 MED ORDER — OMEPRAZOLE 20 MG PO CPDR: 20.0000 mg | DELAYED_RELEASE_CAPSULE | Freq: Every day | ORAL | 0 refills | Status: DC

## 2016-08-21 NOTE — ED Triage Notes (Signed)
Pt c/o of lower abd pain since Monday that has been coming and going. Pt has had 2 episodes of diarrhea.Pt states he was seen at an urgent care yesterday and had a CBC done and states had normal results, pt states he was placed on bactrim yesterday and has taken 1 dose pt states he lower abd is "sore today".

## 2016-08-21 NOTE — Discharge Instructions (Signed)
As discussed, your evaluation today has been largely reassuring.  But, it is important that you monitor your condition carefully, and do not hesitate to return to the ED if you develop new, or concerning changes in your condition. ? ?Otherwise, please follow-up with your physician for appropriate ongoing care. ? ?

## 2016-08-21 NOTE — ED Notes (Signed)
EDP at bedside  

## 2016-08-21 NOTE — ED Notes (Signed)
Patient in CT

## 2016-08-21 NOTE — ED Provider Notes (Signed)
[facility]EMERGENCY DEPT Provider Note   CSN: [id number] Arrival date & time: 08/21/16  1045     History   Chief Complaint Chief Complaint  Patient presents with  . Abdominal Pain    HPI Charles Hess is a 36 y.o. male.  HPI   Patient presents with concern of upper abdominal pain. Onset seems to have been within the past week, and symptoms were initially mild. However, he has gradually developed more persistent, uncomfortable symptoms.  Now, the patient describes episodes of upper abdominal pain, and over the phone hours his pain migrates inferiorly, until he has a loose stool. Bowel movements not completely relieve the symptoms, but the upper abdominal pain is better. There is mild associated nausea, anorexia, but no vomiting. Patient also describes some generalized weakness, palpitations, but no persistent chest pain, no dyspnea, no fever. Patient went to urgent care yesterday, and since that time has had no relief with oral antacids. No other recent medication changes, diet changes, activity changes. Patient has not seen a gastroenterologist for this illness.   Past Medical History:  Diagnosis Date  . Dizziness   . Hypertension   . Palpitations     There are no active problems to display for this patient.   Past Surgical History:  Procedure Laterality Date  . none         Home Medications    Prior to Admission medications   Medication Sig Start Date End Date Taking? Authorizing Provider  metoprolol tartrate (LOPRESSOR) 25 MG tablet Take 0.5 tablets (12.5 mg total) by mouth 2 (two) times daily. 07/27/16 10/25/16 Yes Azalee Course, PA  sulfamethoxazole-trimethoprim (BACTRIM DS,SEPTRA DS) 800-160 MG tablet Take 1 tablet by mouth 2 (two) times daily. 08/20/16 08/24/16 Yes Historical Provider, MD    Family History Family History  Problem Relation Age of Onset  . Heart disease Father     s/p PCI  . Hypertension Father     Social History Social History  Substance  Use Topics  . Smoking status: Never Smoker  . Smokeless tobacco: Never Used  . Alcohol use Yes     Allergies   Patient has no known allergies.   Review of Systems Review of Systems  Constitutional:       Per HPI, otherwise negative  HENT:       Per HPI, otherwise negative  Respiratory:       Per HPI, otherwise negative  Cardiovascular:       Per HPI, otherwise negative  Gastrointestinal: Negative for vomiting.  Endocrine:       Negative aside from HPI  Genitourinary:       Neg aside from HPI   Musculoskeletal:       Per HPI, otherwise negative  Skin: Negative.   Neurological: Negative for syncope.     Physical Exam Updated Vital Signs BP (!) 155/99   Pulse 62   Temp 98.2 F (36.8 C) (Oral)   Resp 20   SpO2 97%   Physical Exam  Constitutional: He is oriented to person, place, and time. He appears well-developed. No distress.  HENT:  Head: Normocephalic and atraumatic.  Eyes: Conjunctivae and EOM are normal.  Cardiovascular: Normal rate and regular rhythm.   Pulmonary/Chest: Effort normal. No stridor. No respiratory distress.  Abdominal: He exhibits no distension.  Minimal midline upper abdominal tenderness, no guarding, rebound  Musculoskeletal: He exhibits no edema.  Neurological: He is alert and oriented to person, place, and time.  Skin: Skin is warm and dry.  Psychiatric: He has a normal mood and affect.  Nursing note and vitals reviewed.    ED Treatments / Results  Labs (all labs ordered are listed, but only abnormal results are displayed) Labs Reviewed  COMPREHENSIVE METABOLIC PANEL  LIPASE, BLOOD  CBC WITH DIFFERENTIAL/PLATELET    EKG  EKG Interpretation None       Radiology Ct Abdomen Pelvis W Contrast  Result Date: 08/21/2016 CLINICAL DATA:  Generalized abdominal pain, diarrhea 2 days EXAM: CT ABDOMEN AND PELVIS WITH CONTRAST TECHNIQUE: Multidetector CT imaging of the abdomen and pelvis was performed using the standard protocol  following bolus administration of intravenous contrast. CONTRAST:  ISOVUE-300 IOPAMIDOL (ISOVUE-300) INJECTION 61% COMPARISON:  06/11/2015 FINDINGS: Lower chest: No acute abnormality. Hepatobiliary: Diffuse low attenuation of the liver as can be seen with hepatic steatosis. No focal liver abnormality is seen. No gallstones, gallbladder wall thickening, or biliary dilatation. Pancreas: Unremarkable. No pancreatic ductal dilatation or surrounding inflammatory changes. Spleen: Normal in size without focal abnormality. Adrenals/Urinary Tract: Adrenal glands are unremarkable. Kidneys are normal, without renal calculi, focal lesion, or hydronephrosis. Bladder is unremarkable. Stomach/Bowel: Stomach is within normal limits. Appendix appears normal. No evidence of bowel wall thickening, distention, or inflammatory changes. Vascular/Lymphatic: No significant vascular findings are present. No enlarged abdominal or pelvic lymph nodes. Reproductive: Prostate is unremarkable. Other: Small fat containing umbilical hernia. No abdominopelvic ascites. Musculoskeletal: No acute or significant osseous findings. IMPRESSION: 1. No acute abdominal or pelvic pathology. 2. Hepatic steatosis. Electronically Signed   By: Elige Ko   On: 08/21/2016 13:23    Procedures Procedures (including critical care time)  Medications Ordered in ED Medications  0.9 %  sodium chloride infusion (not administered)  iopamidol (ISOVUE-300) 61 % injection (100 mLs  Contrast Given 08/21/16 1253)     Initial Impression / Assessment and Plan / ED Course  I have reviewed the triage vital signs and the nursing notes.  Pertinent labs & imaging results that were available during my care of the patient were reviewed by me and considered in my medical decision making (see chart for details).  On repeat exam the patient appears calm. We discussed all findings at length including reassuring labs, CT scan, and with my consideration of gastric etiology  versus inflammatory bowel disease. With no evidence for peritonitis, bacteremia, sepsis, no identified acute abdominal processes the CT scan, patient will start a course of therapy for gastric etiology, follow up with gastroenterology.   Final Clinical Impressions(s) / ED Diagnoses  Abdominal pain   Gerhard Munch, MD 08/21/16 1426

## 2016-08-22 ENCOUNTER — Telehealth: Payer: Self-pay | Admitting: Physician Assistant

## 2016-08-22 ENCOUNTER — Other Ambulatory Visit: Payer: Self-pay | Admitting: Physician Assistant

## 2016-08-22 MED ORDER — AMLODIPINE BESYLATE 5 MG PO TABS: 5.0000 mg | ORAL_TABLET | Freq: Every day | ORAL | 5 refills | Status: DC

## 2016-08-22 NOTE — Telephone Encounter (Signed)
I started the patient on metoprolol tartrate 12.5 mg twice a day for low pressure control. He took the medication for 2 weeks without any discomfort. For the past week, however he has been having abdominal discomfort. He says immediately after eating food, it feels better, however it returns feeling worse. It is in the epigastric area. He was seen in the ED yesterday and was referred to a gastroenterologist after a negative CT with contrast of abdomen. I do not think it is the metoprolol that is causing his issue, however I'm willing to switch the metoprolol to amlodipine 5 mg daily. I will call him again in one week to follow-up on his progress.  Ramond Dial PA Pager: [phone]

## 2016-08-28 ENCOUNTER — Ambulatory Visit (INDEPENDENT_AMBULATORY_CARE_PROVIDER_SITE_OTHER): Payer: 59 | Admitting: Physician Assistant

## 2016-08-28 ENCOUNTER — Telehealth: Payer: Self-pay | Admitting: Physician Assistant

## 2016-08-28 ENCOUNTER — Encounter: Payer: Self-pay | Admitting: Physician Assistant

## 2016-08-28 VITALS — BP 116/80 | HR 82 | Ht 69.0 in | Wt 240.0 lb

## 2016-08-28 DIAGNOSIS — R1011 Right upper quadrant pain: Secondary | ICD-10-CM | POA: Diagnosis not present

## 2016-08-28 DIAGNOSIS — R1012 Left upper quadrant pain: Secondary | ICD-10-CM

## 2016-08-28 NOTE — Progress Notes (Signed)
Subjective:    Patient ID: Charles Hess, male    DOB: 30-Jun-1980, 36 y.o.   MRN: [id number]  HPI Charles Hess is a pleasant 36 year old white male, new to GI today, referred by [facility] after recent ER visit on 08/21/2016 with acute abdominal pain. He has not had any previous GI issues. He was recently diagnosed with hypertension and started on a beta blocker about 2 weeks prior to onset of acute symptoms. ER evaluation with CBC, chemistries and lipase all within normal limits. CT of the abdomen and pelvis showed mouth and hepatic steatosis, a small umbilical hernia containing only fat and otherwise normal exam. Patient says he decided to stop the beta blocker and after 3 days his symptoms completely resolved and have not recurred. He says he was feeling upper abdominal discomfort and fullness gas pressure all of which was worse postprandially. He was also having a difficult time having bowel movements and realized that he was actually constipated. The only other thing he mentions is that over the past 4 months he has had a couple of nocturnal episodes of waking with urgency and then diarrhea. In between these sporadic episodes he has felt perfectly fine. He has discussed the beta blocker with his PCP and has now been switched to amlodipine which he seems to be tolerating well.  Review of Systems Pertinent positive and negative review of systems were noted in the above HPI section.  All other review of systems was otherwise negative.  Outpatient Encounter Prescriptions as of 08/28/2016  Medication Sig  . amLODipine (NORVASC) 5 MG tablet Take 1 tablet (5 mg total) by mouth daily.  . [DISCONTINUED] famotidine (PEPCID AC) 10 MG tablet Take 1 tablet (10 mg total) by mouth 2 (two) times daily.  . [DISCONTINUED] omeprazole (PRILOSEC) 20 MG capsule Take 1 capsule (20 mg total) by mouth daily. Take one tablet daily  . [DISCONTINUED] traMADol (ULTRAM) 50 MG tablet Take 1 tablet (50 mg  total) by mouth every 6 (six) hours as needed.   No facility-administered encounter medications on file as of 08/28/2016.    No Known Allergies There are no active problems to display for this patient.  Social History   Social History  . Marital status: Married    Spouse name: N/A  . Number of children: 1  . Years of education: N/A   Occupational History  . internet security    Social History Main Topics  . Smoking status: Never Smoker  . Smokeless tobacco: Never Used  . Alcohol use Yes     Comment: twice weekly   . Drug use: Unknown  . Sexual activity: Not on file   Other Topics Concern  . Not on file   Social History Narrative  . No narrative on file    Charles Hess family history includes Alzheimer's disease in his maternal grandfather and maternal grandmother; Diabetes in his maternal grandfather; Healthy in his brother and brother; Heart disease in his father and paternal grandfather; Hypertension in his father; Prostate cancer in his maternal grandfather.      Objective:    Vitals:   08/28/16 0832  BP: 116/80  Pulse: 82    Physical Exam    well-developed white male in no acute distress,, pleasant blood pressure 116/80 pulse 82, Height 5 foot 9, weight 240, BMI 35.4. HEENT; nontraumatic normocephalic EOMI PERRLA sclera anicteric, Cardiovascular ;regular rate and rhythm with S1-S2 no murmur or gallop, Pulmonary; clear bilaterally, Abdomen ;soft, nontender nondistended bowel sounds  are active ,there is no palpable mass or hepatosplenomegaly. Rectal ;exam not done, Extremities; no clubbing cyanosis or edema skin warm and dry, Neuropsych; mood and affect appropriate       Assessment & Plan:   #741 36 year old white male with acute upper abdominal pain, dyspepsia gas and constipation are likely secondary to adverse medication effect from beta blocker. Recent labs and CT scan done during the ER visit reassuring. Beta blocker has been stopped and he is now taking  amlodipine and tolerating well.  #2 mild hepatic steatosis #3 hypertension  Plan; I do not think patient needs any other GI evaluation at this time and agree that his symptoms were likely secondary to adverse medication effect. (Beta blocker) We discussed finding of mild hepatic steatosis and management with normalizing BMI, exercise and low-fat diet. Patient will follow-up with Dr. Christella HartiganJacobs or myself on an as-needed basis.  Janeli Lewison S Kalif Kattner PA-C 08/28/2016   Cc: No ref. provider found

## 2016-08-28 NOTE — Progress Notes (Signed)
I agree with the above note, workup

## 2016-08-28 NOTE — Telephone Encounter (Signed)
Called Charles Hess, he is doing well. Abdominal pain went away by Monday, seen by GI today, per pt, no further workup expected from GI. Doing well on amlodipine, SBP in 110s range. No further complaint.  Ramond DialSigned, Arleatha Philipps PA Pager: [phone]

## 2016-08-28 NOTE — Progress Notes (Signed)
Subjective:    Patient ID: Charles Hess, male    DOB: 07/21/1980, 36 y.o.   MRN: [id number]  HPI Charles Hess is a pleasant 36 year old white male, new to GI today referred by the [facility] after visit on 08/21/2016. He has no previous GI history. He had just been started on a beta blocker for new diagnosis of hypertension. Patient says he developed upper abdominal pain discomfort and a spelled up type sensation which was more uncomfortable with any by mouth intake. He also had an abrupt change in his bowel movements, passing only small amounts of soft to loose stool. He did not have any associated melena or hematochezia, no nausea or vomiting, no fever. ER evaluation on 08/21/2016 with CBC, chemistries and lipase all within normal limits.  He had CT of the abdomen and pelvis done with contrast that showed hepatic steatosis and a small umbilical hernia containing only fat and otherwise negative CT. After the ER visit patient was convinced that his blood pressure medicine may have been what was causing his GI symptoms he stopped it for 3 days and  symptoms resolved. He has not had any recurrence. He did speak to his PCP and has just been started on amlodipine. Patient is no family history of GI disease. He does mention that very occasionally perhaps once a month or less over the past couple of months he has had an episode of diarrhea which seems to hit him in the middle of the night. In between these sporadic episodes he has felt totally fine.  Review of Systems Pertinent positive and negative review of systems were noted in the above HPI section.  All other review of systems was otherwise negative.  Outpatient Encounter Prescriptions as of 08/28/2016  Medication Sig  . amLODipine (NORVASC) 5 MG tablet Take 1 tablet (5 mg total) by mouth daily.  . [DISCONTINUED] famotidine (PEPCID AC) 10 MG tablet Take 1 tablet (10 mg total) by mouth 2 (two) times daily.  . [DISCONTINUED]  omeprazole (PRILOSEC) 20 MG capsule Take 1 capsule (20 mg total) by mouth daily. Take one tablet daily  . [DISCONTINUED] traMADol (ULTRAM) 50 MG tablet Take 1 tablet (50 mg total) by mouth every 6 (six) hours as needed.   No facility-administered encounter medications on file as of 08/28/2016.    No Known Allergies There are no active problems to display for this patient.  Social History   Social History  . Marital status: Married    Spouse name: N/A  . Number of children: 1  . Years of education: N/A   Occupational History  . internet security    Social History Main Topics  . Smoking status: Never Smoker  . Smokeless tobacco: Never Used  . Alcohol use Yes     Comment: twice weekly   . Drug use: Unknown  . Sexual activity: Not on file   Other Topics Concern  . Not on file   Social History Narrative  . No narrative on file    Mr. Gearin family history includes Alzheimer's disease in his maternal grandfather and maternal grandmother; Diabetes in his maternal grandfather; Healthy in his brother and brother; Heart disease in his father and paternal grandfather; Hypertension in his father; Prostate cancer in his maternal grandfather.      Objective:    Vitals:   08/28/16 0832  BP: 116/80  Pulse: 82    Physical Exam   well-developed white male in no acute distress, pleasant blood pressure 116/80  pulse 82, Height 5 foot 9, weight 240, BMI 35.4. HEENT; nontraumatic normocephalic EOMI PERRLA sclera anicteric, Cardiovascular; regular rate and rhythm with S1-S2 no murmur or gallop, Pulmonary; clear bilaterally, Abdomen; soft, nontender nondistended bowel sounds are active there is no palpable mass or hepatosplenomegaly rectal exam not done, Extremities; no clubbing cyanosis or edema skin warm and dry, Neuropsych; mood and affect appropriate       Assessment & Plan:   #251 36 year old white male with acute upper abdominal pain gas, obstipation all resolved asked for  discontinuation of beta blocker which she had just started 2 weeks earlier. Symptoms very likely secondary to adverse drug effect. Reassuring negative CT and labs #2 hepatic steatosis #3 very small umbilical hernia containing only fat  Plan; I do not think patient needs any further GI workup at this time. We discussed hepatic steatosis and initial management with weight loss, exercise and low-fat diet, minimizing alcohol. Patient will follow up with Dr. Christella HartiganJacobs or myself on an as-needed basis.    Amy S Esterwood PA-C 08/28/2016   Cc: No ref. provider found

## 2016-08-28 NOTE — Patient Instructions (Signed)
Glad your doing better.   Follow up with Dr Christella HartiganJacobs or Mike GipAmy Esterwood PA-C as needed.   I appreciate the opportunity to care for you.

## 2016-08-31 NOTE — Progress Notes (Signed)
I agree with the above note, plan 

## 2016-10-16 ENCOUNTER — Encounter: Payer: Self-pay | Admitting: Cardiology

## 2016-11-01 NOTE — Progress Notes (Signed)
Cardiology Office Note    Date:  11/03/2016   ID:  Charles Blueicholas J Ishler, DOB [birth year], MRN [id number]  PCP:  Patient, No Pcp Per  Cardiologist:  Peter SwazilandJordan MD  Chief Complaint  Patient presents with  . Hypertension    History of Present Illness:  Charles Hess is a 36 y.o. male seen for follow up HTN.  I had seen him in the remote past for palpitations.   Heart monitor was reportedly normal.  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   He is a Chartered loss adjusternetwork security officer. Admits that he is fairly sedentary. Does work around the house. Has a 36 year old son.   Noted in December and Januar having elevated blood pressure. His baseline blood pressure has been read in the 140s to 150s range, but with anxiety episode his systolic blood pressure can go up to 180s. He was initially tried on low dose Toprol XL but could not tolerate this due to Abdominal pain. Was switched to amlodipine which he is tolerating well. BP in morning around 128/88. In evening 120/70.    Past Medical History:  Diagnosis Date  . Dizziness   . Hypertension   . Palpitations     Past Surgical History:  Procedure Laterality Date  . none      Current Medications: Outpatient Medications Prior to Visit  Medication Sig Dispense Refill  . amLODipine (NORVASC) 5 MG tablet Take 1 tablet (5 mg total) by mouth daily. 30 tablet 5   No facility-administered medications prior to visit.      Allergies:   Patient has no known allergies.   Social History   Social History  . Marital status: Married    Spouse name: N/A  . Number of children: 1  . Years of education: N/A   Occupational History  . internet security    Social History Main Topics  . Smoking status: Never Smoker  . Smokeless tobacco: Never Used  . Alcohol use Yes     Comment: twice weekly   . Drug use: No  . Sexual activity: Not Asked   Other Topics Concern  . None   Social  History Narrative  . None     Family History:  The patient's family history includes Alzheimer's disease in his maternal grandfather and maternal grandmother; Diabetes in his maternal grandfather; Healthy in his brother and brother; Heart disease in his father and paternal grandfather; Hypertension in his father; Prostate cancer in his maternal grandfather.   ROS:   Please see the history of present illness.    ROS All other systems reviewed and are negative.   PHYSICAL EXAM:   VS:  BP (!) 129/94   Pulse 81   Ht 5\' 8"  (1.727 m)   Wt 242 lb (109.8 kg)   BMI 36.80 kg/m    GEN: Well nourished, well developed, in no acute distress  HEENT: normal  Neck: no JVD, carotid bruits, or masses Cardiac: RRR; no murmurs, rubs, or gallops,no edema  Respiratory:  clear to auscultation bilaterally, normal work of breathing GI: soft, nontender, nondistended, + BS MS: no deformity or atrophy  Skin: warm and dry, no rash Neuro:  Alert and Oriented x 3, Strength and sensation are intact Psych: euthymic mood, full affect  Wt Readings from Last 3 Encounters:  11/03/16 242 lb (109.8 kg)  08/28/16 240 lb (108.9 kg)  07/27/16 241 lb 6.4 oz (109.5 kg)  Studies/Labs Reviewed:   EKG:  EKG is not ordered today.    Recent Labs: 08/21/2016: ALT 29; BUN 9; Creatinine, Ser 1.05; Hemoglobin 15.9; Platelets 284; Potassium 4.0; Sodium 136   Lipid Panel No results found for: CHOL, TRIG, HDL, CHOLHDL, VLDL, LDLCALC, LDLDIRECT  Additional studies/ records that were reviewed today include:  none    ASSESSMENT:    1. Essential hypertension      PLAN:  In order of problems listed above:  1. Hypertension: BP control improved on amlodipine. Tolerating well. Counseled on need for lifestyle modification with weight loss and increased aerobic activity. Will continue current therapy and follow up in 6 months.    Medication Adjustments/Labs and Tests Ordered: Current medicines are reviewed at  length with the patient today.  Concerns regarding medicines are outlined above.  Medication changes, Labs and Tests ordered today are listed in the Patient Instructions below. There are no Patient Instructions on file for this visit.   Signed, Peter Swaziland, MD  11/03/2016 9:28 AM    [facility] [address] [place], [place], [place]  [address] Phone: [phone]; Fax: [phone]

## 2016-11-03 ENCOUNTER — Encounter: Payer: Self-pay | Admitting: Cardiology

## 2016-11-03 ENCOUNTER — Ambulatory Visit (INDEPENDENT_AMBULATORY_CARE_PROVIDER_SITE_OTHER): Payer: 59 | Admitting: Cardiology

## 2016-11-03 VITALS — BP 129/94 | HR 81 | Ht 68.0 in | Wt 242.0 lb

## 2016-11-03 DIAGNOSIS — I10 Essential (primary) hypertension: Secondary | ICD-10-CM

## 2016-11-03 MED ORDER — AMLODIPINE BESYLATE 5 MG PO TABS: 5.0000 mg | ORAL_TABLET | Freq: Every day | ORAL | 3 refills | Status: DC

## 2016-11-03 NOTE — Patient Instructions (Addendum)
Medication Instructions:  Continue current medications  Labwork: None Ordered  Testing/Procedures: None Ordered  Follow-Up: Your physician wants you to follow-up in: 6 Months. You will receive a reminder letter in the mail two months in advance. If you don't receive a letter, please call our office to schedule the follow-up appointment.   Any Other Special Instructions Will Be Listed Below (If Applicable).    Calorie Counting for Weight Loss Calories are units of energy. Your body needs a certain amount of calories from food to keep you going throughout the day. When you eat more calories than your body needs, your body stores the extra calories as fat. When you eat fewer calories than your body needs, your body burns fat to get the energy it needs. Calorie counting means keeping track of how many calories you eat and drink each day. Calorie counting can be helpful if you need to lose weight. If you make sure to eat fewer calories than your body needs, you should lose weight. Ask your health care provider what a healthy weight is for you. For calorie counting to work, you will need to eat the right number of calories in a day in order to lose a healthy amount of weight per week. A dietitian can help you determine how many calories you need in a day and will give you suggestions on how to reach your calorie goal.  A healthy amount of weight to lose per week is usually 1-2 lb (0.5-0.9 kg). This usually means that your daily calorie intake should be reduced by 500-750 calories.  Eating 1,200 - 1,500 calories per day can help most women lose weight.  Eating 1,500 - 1,800 calories per day can help most men lose weight.  What is my plan? My goal is to have __________ calories per day. If I have this many calories per day, I should lose around __________ pounds per week. What do I need to know about calorie counting? In order to meet your daily calorie goal, you will need to:  Find out how  many calories are in each food you would like to eat. Try to do this before you eat.  Decide how much of the food you plan to eat.  Write down what you ate and how many calories it had. Doing this is called keeping a food log.  To successfully lose weight, it is important to balance calorie counting with a healthy lifestyle that includes regular activity. Aim for 150 minutes of moderate exercise (such as walking) or 75 minutes of vigorous exercise (such as running) each week. Where do I find calorie information?  The number of calories in a food can be found on a Nutrition Facts label. If a food does not have a Nutrition Facts label, try to look up the calories online or ask your dietitian for help. Remember that calories are listed per serving. If you choose to have more than one serving of a food, you will have to multiply the calories per serving by the amount of servings you plan to eat. For example, the label on a package of bread might say that a serving size is 1 slice and that there are 90 calories in a serving. If you eat 1 slice, you will have eaten 90 calories. If you eat 2 slices, you will have eaten 180 calories. How do I keep a food log? Immediately after each meal, record the following information in your food log:  What you ate. Don't forget  to include toppings, sauces, and other extras on the food.  How much you ate. This can be measured in cups, ounces, or number of items.  How many calories each food and drink had.  The total number of calories in the meal.  Keep your food log near you, such as in a small notebook in your pocket, or use a mobile app or website. Some programs will calculate calories for you and show you how many calories you have left for the day to meet your goal. What are some calorie counting tips?  Use your calories on foods and drinks that will fill you up and not leave you hungry: ? Some examples of foods that fill you up are nuts and nut butters,  vegetables, lean proteins, and high-fiber foods like whole grains. High-fiber foods are foods with more than 5 g fiber per serving. ? Drinks such as sodas, specialty coffee drinks, alcohol, and juices have a lot of calories, yet do not fill you up.  Eat nutritious foods and avoid empty calories. Empty calories are calories you get from foods or beverages that do not have many vitamins or protein, such as candy, sweets, and soda. It is better to have a nutritious high-calorie food (such as an avocado) than a food with few nutrients (such as a bag of chips).  Know how many calories are in the foods you eat most often. This will help you calculate calorie counts faster.  Pay attention to calories in drinks. Low-calorie drinks include water and unsweetened drinks.  Pay attention to nutrition labels for "low fat" or "fat free" foods. These foods sometimes have the same amount of calories or more calories than the full fat versions. They also often have added sugar, starch, or salt, to make up for flavor that was removed with the fat.  Find a way of tracking calories that works for you. Get creative. Try different apps or programs if writing down calories does not work for you. What are some portion control tips?  Know how many calories are in a serving. This will help you know how many servings of a certain food you can have.  Use a measuring cup to measure serving sizes. You could also try weighing out portions on a kitchen scale. With time, you will be able to estimate serving sizes for some foods.  Take some time to put servings of different foods on your favorite plates, bowls, and cups so you know what a serving looks like.  Try not to eat straight from a bag or box. Doing this can lead to overeating. Put the amount you would like to eat in a cup or on a plate to make sure you are eating the right portion.  Use smaller plates, glasses, and bowls to prevent overeating.  Try not to multitask  (for example, watch TV or use your computer) while eating. If it is time to eat, sit down at a table and enjoy your food. This will help you to know when you are full. It will also help you to be aware of what you are eating and how much you are eating. What are tips for following this plan? Reading food labels  Check the calorie count compared to the serving size. The serving size may be smaller than what you are used to eating.  Check the source of the calories. Make sure the food you are eating is high in vitamins and protein and low in saturated and trans fats.  Shopping  Read nutrition labels while you shop. This will help you make healthy decisions before you decide to purchase your food.  Make a grocery list and stick to it. Cooking  Try to cook your favorite foods in a healthier way. For example, try baking instead of frying.  Use low-fat dairy products. Meal planning  Use more fruits and vegetables. Half of your plate should be fruits and vegetables.  Include lean proteins like poultry and fish. How do I count calories when eating out?  Ask for smaller portion sizes.  Consider sharing an entree and sides instead of getting your own entree.  If you get your own entree, eat only half. Ask for a box at the beginning of your meal and put the rest of your entree in it so you are not tempted to eat it.  If calories are listed on the menu, choose the lower calorie options.  Choose dishes that include vegetables, fruits, whole grains, low-fat dairy products, and lean protein.  Choose items that are boiled, broiled, grilled, or steamed. Stay away from items that are buttered, battered, fried, or served with cream sauce. Items labeled "crispy" are usually fried, unless stated otherwise.  Choose water, low-fat milk, unsweetened iced tea, or other drinks without added sugar. If you want an alcoholic beverage, choose a lower calorie option such as a glass of wine or light beer.  Ask  for dressings, sauces, and syrups on the side. These are usually high in calories, so you should limit the amount you eat.  If you want a salad, choose a garden salad and ask for grilled meats. Avoid extra toppings like bacon, cheese, or fried items. Ask for the dressing on the side, or ask for olive oil and vinegar or lemon to use as dressing.  Estimate how many servings of a food you are given. For example, a serving of cooked rice is  cup or about the size of half a baseball. Knowing serving sizes will help you be aware of how much food you are eating at restaurants. The list below tells you how big or small some common portion sizes are based on everyday objects: ? 1 oz-4 stacked dice. ? 3 oz-1 deck of cards. ? 1 tsp-1 die. ? 1 Tbsp- a ping-pong ball. ? 2 Tbsp-1 ping-pong ball. ?  cup- baseball. ? 1 cup-1 baseball. Summary  Calorie counting means keeping track of how many calories you eat and drink each day. If you eat fewer calories than your body needs, you should lose weight.  A healthy amount of weight to lose per week is usually 1-2 lb (0.5-0.9 kg). This usually means reducing your daily calorie intake by 500-750 calories.  The number of calories in a food can be found on a Nutrition Facts label. If a food does not have a Nutrition Facts label, try to look up the calories online or ask your dietitian for help.  Use your calories on foods and drinks that will fill you up, and not on foods and drinks that will leave you hungry.  Use smaller plates, glasses, and bowls to prevent overeating. This information is not intended to replace advice given to you by your health care provider. Make sure you discuss any questions you have with your health care provider. Document Released: 04/13/2005 Document Revised: 03/13/2016 Document Reviewed: 03/13/2016 Elsevier Interactive Patient Education  2017 [employer]. If you need a refill on your cardiac medications before your next  appointment, please call your pharmacy.

## 2017-05-02 ENCOUNTER — Telehealth: Payer: Self-pay | Admitting: Internal Medicine

## 2017-05-02 NOTE — Telephone Encounter (Signed)
Cardiology Moonlighter Note  Returned page from patient. Accidentally took double dose of his amlodipine 5mg . Asymptomatic. Recommend patient monitor himself for symptoms. Has a home BP cuff that he will use if he develops any symptoms. Recommended calling back with any new symptoms or questions.   Rosario JacksAnthony Philip Carnicelli, MD Cardiology Fellow, PGY-5

## 2017-05-16 NOTE — Progress Notes (Signed)
Cardiology Office Note    Date:  05/17/2017   ID:  Charles Hess, DOB 1980/12/14, MRN [id number]  PCP:  Charles Hess, Scott, Hess  Cardiologist:  Charles SwazilandJordan Hess  Chief Complaint  Patient presents with  . Follow-up  . Hypertension    History of Present Illness:  Charles Hess is a 37 y.o. male seen for follow up HTN.  He has a remote history of palpitations.   Heart monitor was reportedly normal.  His wife Charles HarmanDana used to work for [employer]- now works with [employer] in IT trainerproject management.   He is a Chartered loss adjusternetwork security officer.  Has a 37 year old son.   Noted last year having elevated blood pressure. His baseline blood pressure has been read in the 140s to 150s range, but with anxiety episode his systolic blood pressure can go up to 180s. He was initially tried on low dose Toprol XL but could not tolerate this due to abdominal pain. Was switched to amlodipine which he is tolerating well.   On follow up today he does note some chest discomfort on days when he misses taking his amlodipine. Denies chest pain at other times. He is walking 30 minutes 3 days a week. Weight down 2 lbs.    Past Medical History:  Diagnosis Date  . Dizziness   . Hypertension   . Palpitations     Past Surgical History:  Procedure Laterality Date  . none      Current Medications: Outpatient Medications Prior to Visit  Medication Sig Dispense Refill  . amLODipine (NORVASC) 5 MG tablet Take 1 tablet (5 mg total) by mouth daily. 90 tablet 3   No facility-administered medications prior to visit.      Allergies:   Patient has no known allergies.   Social History   Socioeconomic History  . Marital status: Married    Spouse name: None  . Number of children: 1  . Years of education: None  . Highest education level: None  Social Needs  . Financial resource strain: None  . Food insecurity - worry: None  . Food insecurity - inability: None  . Transportation needs - medical: None  .  Transportation needs - non-medical: None  Occupational History  . Occupation: Charity fundraiserinternet security  Tobacco Use  . Smoking status: Never Smoker  . Smokeless tobacco: Never Used  Substance and Sexual Activity  . Alcohol use: Yes    Comment: twice weekly   . Drug use: No  . Sexual activity: None  Other Topics Concern  . None  Social History Narrative  . None     Family History:  The patient's family history includes Alzheimer's disease in his maternal grandfather and maternal grandmother; Diabetes in his maternal grandfather; Healthy in his brother and brother; Heart disease in his father and paternal grandfather; Hypertension in his father; Prostate cancer in his maternal grandfather.   ROS:   Please see the history of present illness.    ROS All other systems reviewed and are negative.   PHYSICAL EXAM:   VS:  BP 132/78   Pulse 82   Ht 5\' 9"  (1.753 m)   Wt 240 lb 12.8 oz (109.2 kg)   BMI 35.56 kg/m    GENERAL:  Well appearing, obese WM in NAD HEENT:  PERRL, EOMI, sclera are clear. Oropharynx is clear. NECK:  No jugular venous distention, carotid upstroke brisk and symmetric, no bruits, no thyromegaly or adenopathy LUNGS:  Clear to auscultation bilaterally CHEST:  Unremarkable HEART:  RRR,  PMI not displaced or sustained,S1 and S2 within normal limits, no S3, no S4: no clicks, no rubs, no murmurs ABD:  Soft, nontender. BS +, no masses or bruits. No hepatomegaly, no splenomegaly EXT:  2 + pulses throughout, no edema, no cyanosis no clubbing SKIN:  Warm and dry.  No rashes NEURO:  Alert and oriented x 3. Cranial nerves II through XII intact. PSYCH:  Cognitively intact    Wt Readings from Last 3 Encounters:  05/17/17 240 lb 12.8 oz (109.2 kg)  11/03/16 242 lb (109.8 kg)  08/28/16 240 lb (108.9 kg)      Studies/Labs Reviewed:   EKG:  EKG is not ordered today.    Recent Labs: 08/21/2016: ALT 29; BUN 9; Creatinine, Ser 1.05; Hemoglobin 15.9; Platelets 284; Potassium 4.0;  Sodium 136   Lipid Panel No results found for: CHOL, TRIG, HDL, CHOLHDL, VLDL, LDLCALC, LDLDIRECT  Additional studies/ records that were reviewed today include:  Labs dated 10/23/09: cholesterol 162, triglycerides 125, HDL 41, LDL 96    ASSESSMENT:    1. Essential hypertension   2. Chest pain, unspecified type      PLAN:  In order of problems listed above:  1. Hypertension: BP control well controlled on amlodipine. Tolerating well. Counseled on need for continued lifestyle modification with weight loss and increased aerobic activity.  2. Chest pain. Recommend ETT. Will also check lipids today.    Medication Adjustments/Labs and Tests Ordered: Current medicines are reviewed at length with the patient today.  Concerns regarding medicines are outlined above.  Medication changes, Labs and Tests ordered today are listed in the Patient Instructions below. Patient Instructions  We will check blood work today  We will schedule you for a stress test      Signed, Charles Swaziland, Hess  05/17/2017 8:13 AM    [facility] [address] [place], [place], [place]  [address] Phone: [phone]; Fax: [phone]

## 2017-05-17 ENCOUNTER — Ambulatory Visit (INDEPENDENT_AMBULATORY_CARE_PROVIDER_SITE_OTHER): Payer: BLUE CROSS/BLUE SHIELD | Admitting: Cardiology

## 2017-05-17 ENCOUNTER — Encounter: Payer: Self-pay | Admitting: Cardiology

## 2017-05-17 VITALS — BP 132/78 | HR 82 | Ht 69.0 in | Wt 240.8 lb

## 2017-05-17 DIAGNOSIS — I10 Essential (primary) hypertension: Secondary | ICD-10-CM | POA: Diagnosis not present

## 2017-05-17 DIAGNOSIS — R079 Chest pain, unspecified: Secondary | ICD-10-CM | POA: Diagnosis not present

## 2017-05-17 LAB — LIPID PANEL W/O CHOL/HDL RATIO
CHOLESTEROL TOTAL: 148 mg/dL (ref 100–199)
HDL: 42 mg/dL (ref >39)
LDL Calculated: 92 mg/dL (ref 0–99)
Triglycerides: 72 mg/dL (ref 0–149)
VLDL Cholesterol Cal: 14 mg/dL (ref 5–40)

## 2017-05-17 LAB — HEPATIC FUNCTION PANEL
ALBUMIN: 4.8 g/dL (ref 3.5–5.5)
ALT: 27 IU/L (ref 0–44)
AST: 21 IU/L (ref 0–40)
Alkaline Phosphatase: 75 IU/L (ref 39–117)
BILIRUBIN TOTAL: 0.4 mg/dL (ref 0.0–1.2)
BILIRUBIN, DIRECT: 0.14 mg/dL (ref 0.00–0.40)
Total Protein: 7.3 g/dL (ref 6.0–8.5)

## 2017-05-17 LAB — BASIC METABOLIC PANEL
BUN / CREAT RATIO: 13 (ref 9–20)
BUN: 13 mg/dL (ref 6–20)
CHLORIDE: 107 mmol/L — AB (ref 96–106)
CO2: 21 mmol/L (ref 20–29)
Calcium: 9 mg/dL (ref 8.7–10.2)
Creatinine, Ser: 0.98 mg/dL (ref 0.76–1.27)
GFR calc non Af Amer: 99 mL/min/{1.73_m2} (ref >59)
GFR, EST AFRICAN AMERICAN: 114 mL/min/{1.73_m2} (ref >59)
GLUCOSE: 88 mg/dL (ref 65–99)
POTASSIUM: 4.4 mmol/L (ref 3.5–5.2)
SODIUM: 142 mmol/L (ref 134–144)

## 2017-05-17 MED ORDER — AMLODIPINE BESYLATE 5 MG PO TABS: 5.0000 mg | ORAL_TABLET | Freq: Every day | ORAL | 3 refills | Status: DC

## 2017-05-17 NOTE — Patient Instructions (Signed)
We will check blood work today  We will schedule you for a stress test

## 2017-05-20 ENCOUNTER — Telehealth (HOSPITAL_COMMUNITY): Payer: Self-pay

## 2017-05-20 NOTE — Telephone Encounter (Signed)
Encounter complete. 

## 2017-05-25 ENCOUNTER — Ambulatory Visit (HOSPITAL_COMMUNITY)
Admission: RE | Admit: 2017-05-25 | Discharge: 2017-05-25 | Disposition: A | Discharge status: Home / Self Care | Payer: BLUE CROSS/BLUE SHIELD | Source: Ambulatory Visit | Attending: Internal Medicine | Admitting: Internal Medicine

## 2017-05-25 DIAGNOSIS — I10 Essential (primary) hypertension: Secondary | ICD-10-CM | POA: Diagnosis not present

## 2017-05-25 DIAGNOSIS — R079 Chest pain, unspecified: Secondary | ICD-10-CM | POA: Diagnosis not present

## 2017-05-25 LAB — EXERCISE TOLERANCE TEST
CHL CUP RESTING HR STRESS: 78 {beats}/min
CSEPEW: 11.3 METS
CSEPPHR: 187 {beats}/min
Exercise duration (min): 9 min
Exercise duration (sec): 46 s
MPHR: 184 {beats}/min
Percent HR: 101 %
RPE: 17

## 2017-09-04 IMAGING — CT CT RENAL STONE PROTOCOL
2 of 4 series · 9 of 46 positions shown, 10 images · non-contrast
Comparison: None.

CLINICAL DATA: Left-sided flank pain for several hours

EXAM:
CT ABDOMEN AND PELVIS WITHOUT CONTRAST
TECHNIQUE: Multidetector CT imaging of the abdomen and pelvis was performed
following the standard protocol without IV contrast.

[Series 201: stone study, idose (2) · axial · 0.85mm/px · z∈[+105,+530]mm · 6 of 105 slices shown, 7 images]
[im 10/105  soft-tissue]
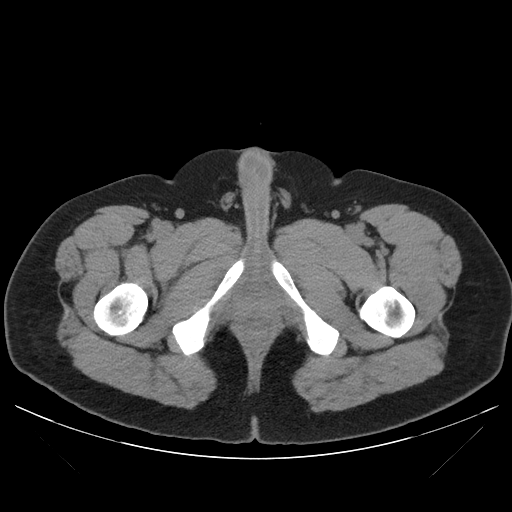
[im 10/105  bone]
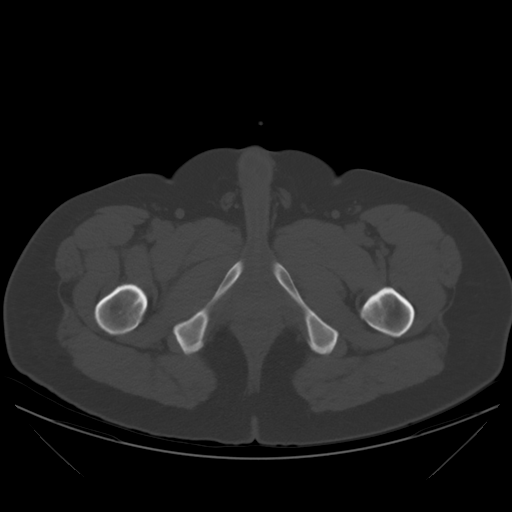
[im 28/105  soft-tissue]
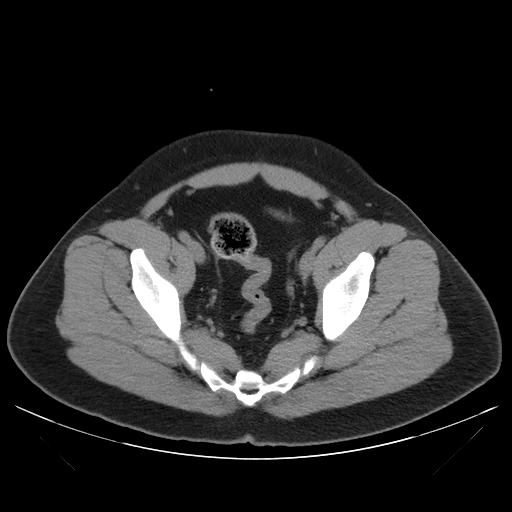
[im 46/105  soft-tissue]
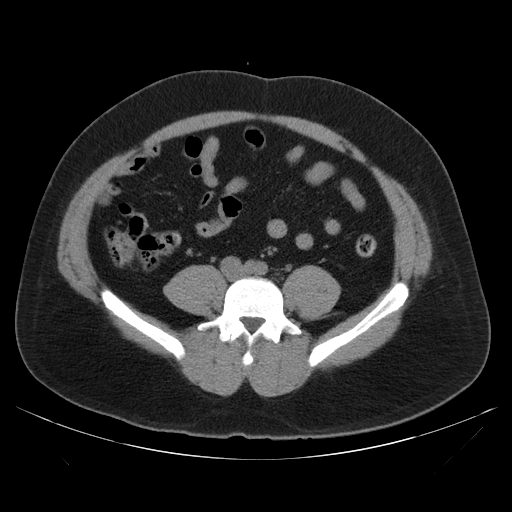
[im 59/105  soft-tissue]
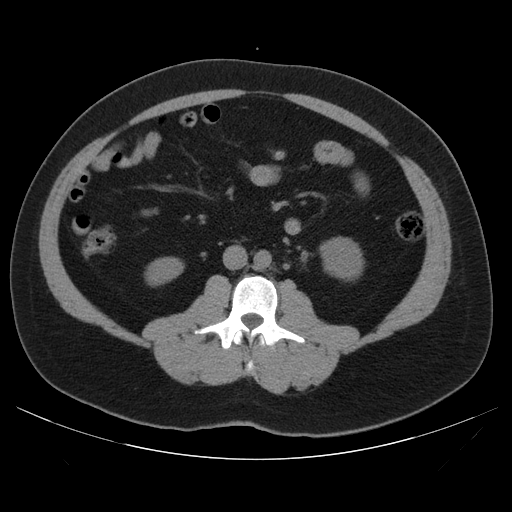
[im 77/105  soft-tissue]
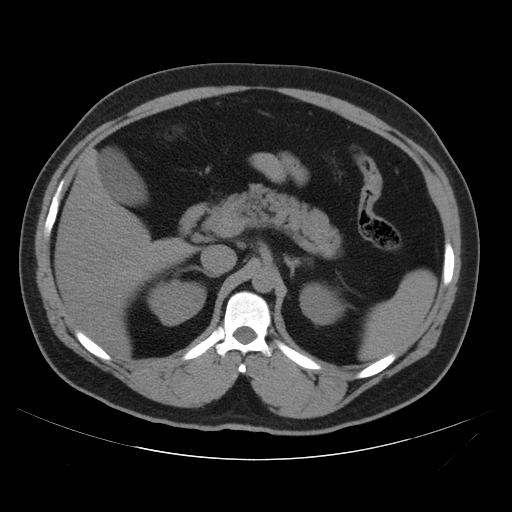
[im 95/105  soft-tissue]
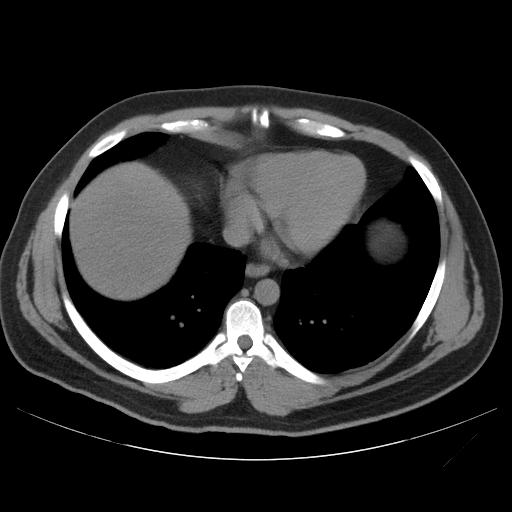

[Series 203: coronals, idose (2) · coronal · 0.45mm/px · 3 of 152 slices shown]
[im 51/152  soft-tissue]
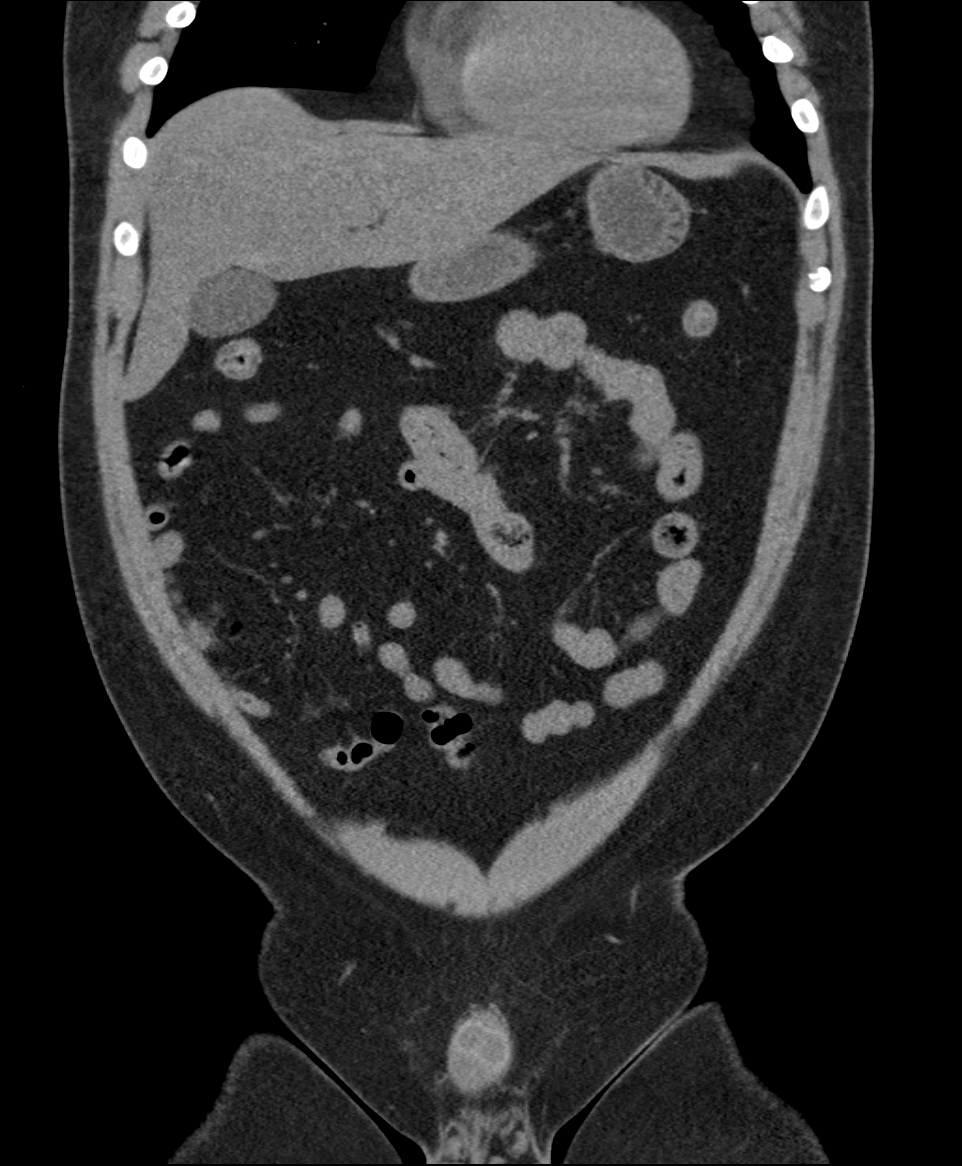
[im 68/152  soft-tissue]
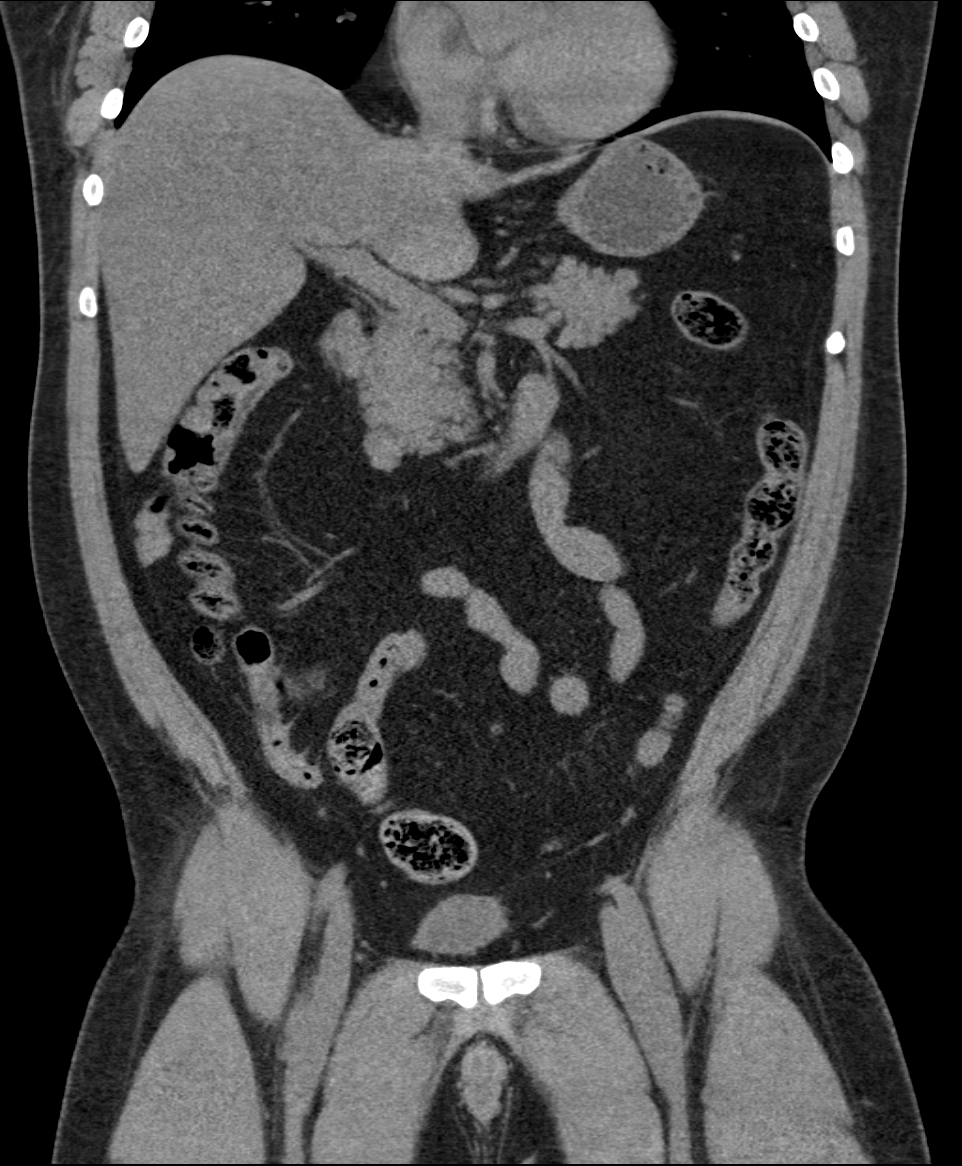
[im 84/152  soft-tissue]
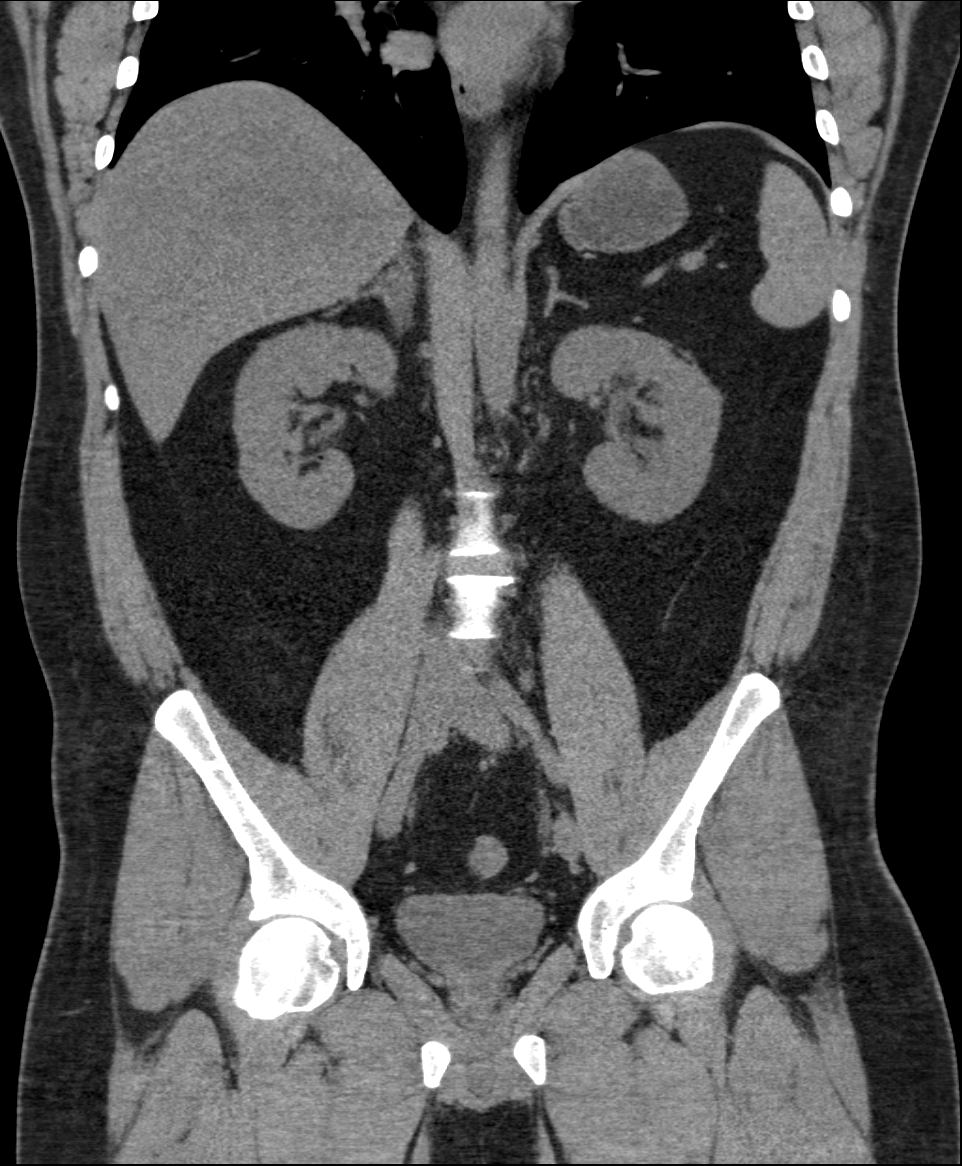

[9 of 46 positions shown; findings below may reference images not displayed]

FINDINGS: Lung bases are free of acute infiltrate or sizable effusion.

The liver, gallbladder, spleen, adrenal glands and pancreas are
normal in their CT appearance. The right kidney is well visualized
and within normal limits. The left kidney demonstrates evidence of
mild hydronephrosis and hydroureter extending to the left UVJ. At
this point there is a 2 mm partially obstructing stone identified.
The bladder is well distended. No pelvic mass lesion is seen. The
appendix is within normal limits. No acute bony abnormality is
noted.
IMPRESSION: Left UVJ stone with partial obstruction of the left collecting
system and ureter.

## 2018-06-07 ENCOUNTER — Telehealth: Payer: Self-pay | Admitting: Cardiology

## 2018-06-07 ENCOUNTER — Other Ambulatory Visit: Payer: Self-pay | Admitting: Cardiology

## 2018-06-07 MED ORDER — AMLODIPINE BESYLATE 5 MG PO TABS: 5.0000 mg | ORAL_TABLET | Freq: Every day | ORAL | 0 refills | Status: DC

## 2018-06-07 NOTE — Telephone Encounter (Signed)
New Message            *STAT* If patient is at the pharmacy, call can be transferred to refill team.   1. Which medications need to be refilled? (please list name of each medication and dose if known) Amlodipine 5 mg  2. Which pharmacy/location (including street and city if local pharmacy) is medication to be sent to? [facility] on [address] (In [place])  3. Do they need a 30 day or 90 day supply? 90 Patient has a scheduled appt in 08/05/2018

## 2018-06-07 NOTE — Telephone Encounter (Signed)
Pt advised that RX sent in for 90 day amlodipine with no refills to be sure to keep his office visit with Dr. Swaziland 07/2018.

## 2018-08-02 ENCOUNTER — Other Ambulatory Visit: Payer: Self-pay

## 2018-08-02 MED ORDER — AMLODIPINE BESYLATE 5 MG PO TABS: 5.0000 mg | ORAL_TABLET | Freq: Every day | ORAL | 3 refills | Status: DC

## 2018-08-02 MED ORDER — AMLODIPINE BESYLATE 5 MG PO TABS: 5.0000 mg | ORAL_TABLET | Freq: Every day | ORAL | 3 refills | Status: AC

## 2018-08-05 ENCOUNTER — Ambulatory Visit: Payer: BLUE CROSS/BLUE SHIELD | Admitting: Cardiology

## 2018-11-15 IMAGING — CT CT ABD-PELV W/ CM
2 of 5 series · 16 of 46 positions shown, 18 images · IV contrast (iopamidol)
Comparison: 06/11/2015

CLINICAL DATA: Generalized abdominal pain, diarrhea 2 days

EXAM:
CT ABDOMEN AND PELVIS WITH CONTRAST
TECHNIQUE: Multidetector CT imaging of the abdomen and pelvis was performed
using the standard protocol following bolus administration of
intravenous contrast.
CONTRAST:  FXMWTJ-S22 IOPAMIDOL (FXMWTJ-S22) INJECTION 61%

[Series 3: abdroutine 5.0 i31s 1 · axial · 0.93mm/px · z∈[-518,-33]mm · 13 of 109 slices shown, 15 images]
[im 6/109  soft-tissue]
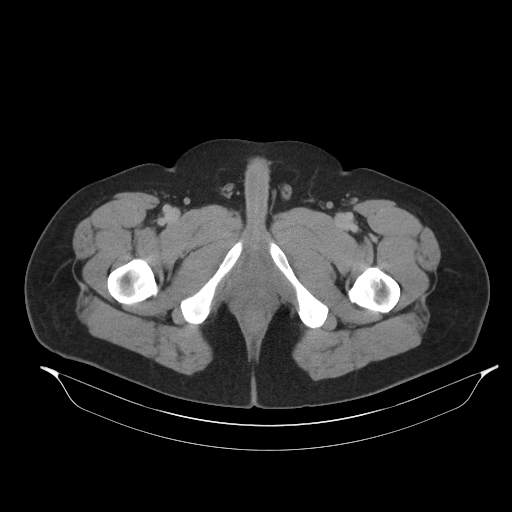
[im 6/109  bone]
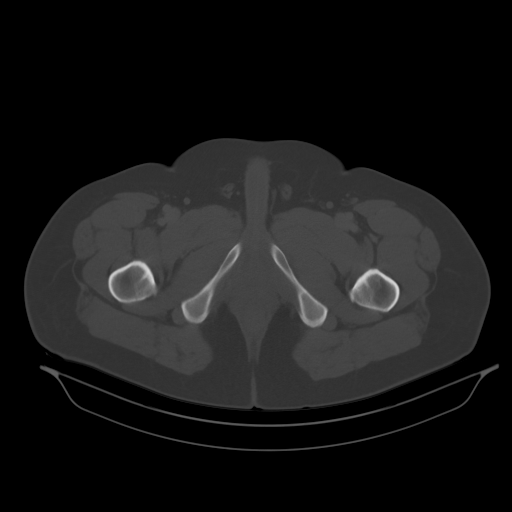
[im 18/109  soft-tissue]
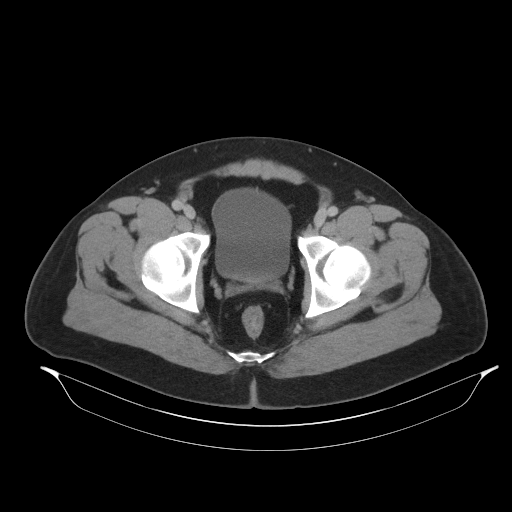
[im 23/109  soft-tissue]
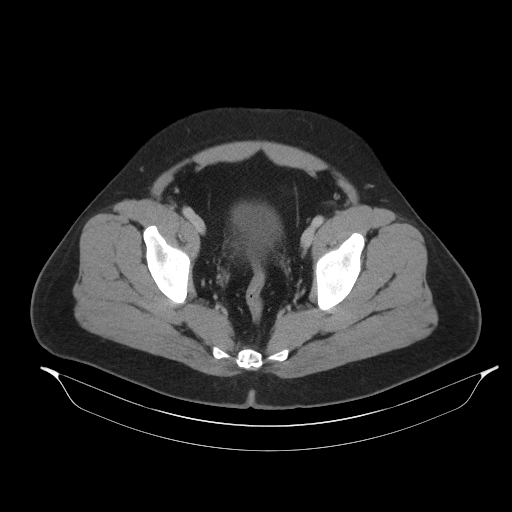
[im 29/109  soft-tissue]
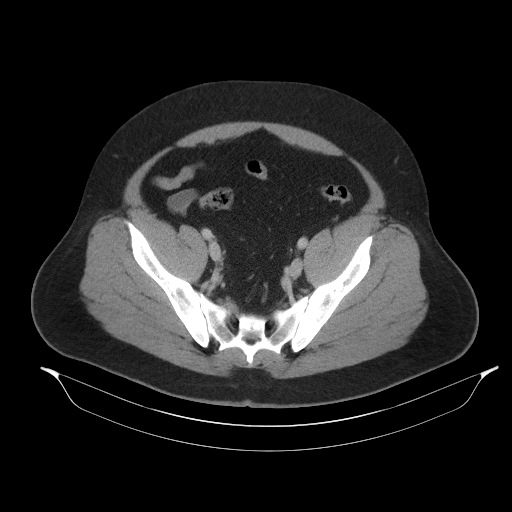
[im 40/109  soft-tissue]
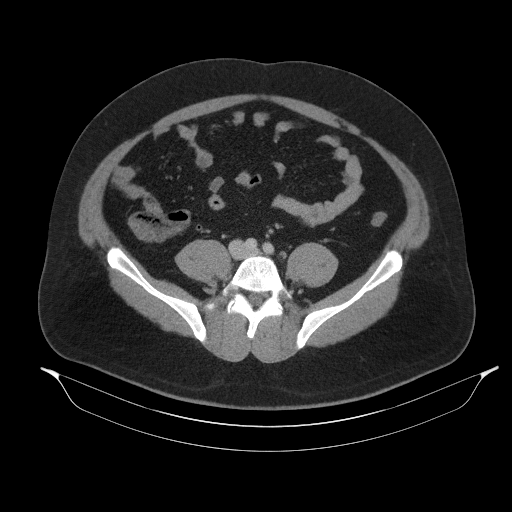
[im 46/109  soft-tissue]
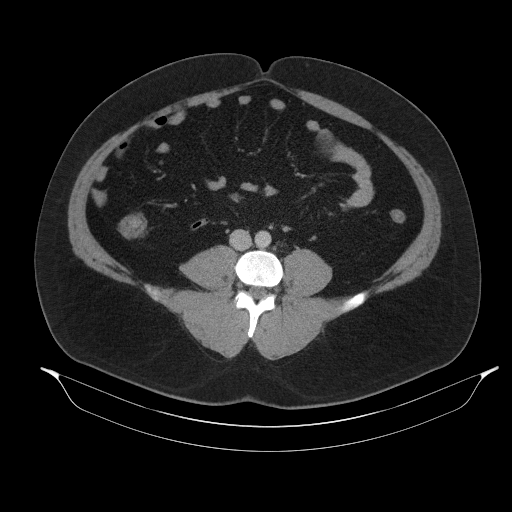
[im 57/109  soft-tissue]
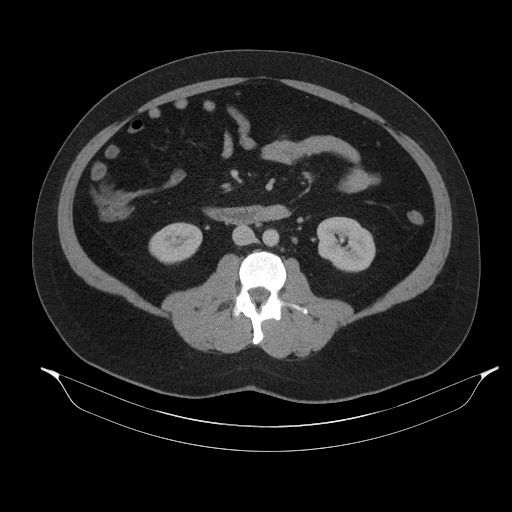
[im 63/109  soft-tissue]
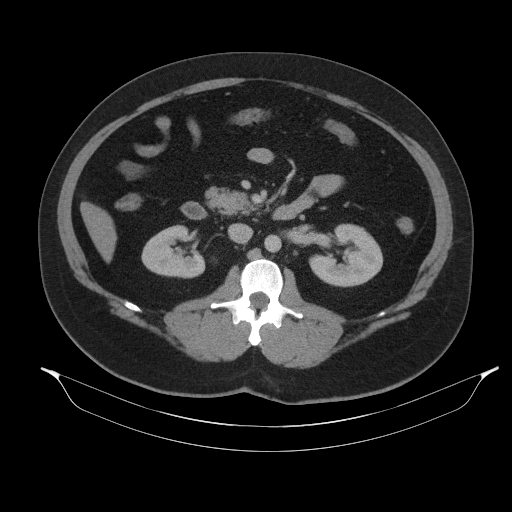
[im 69/109  soft-tissue]
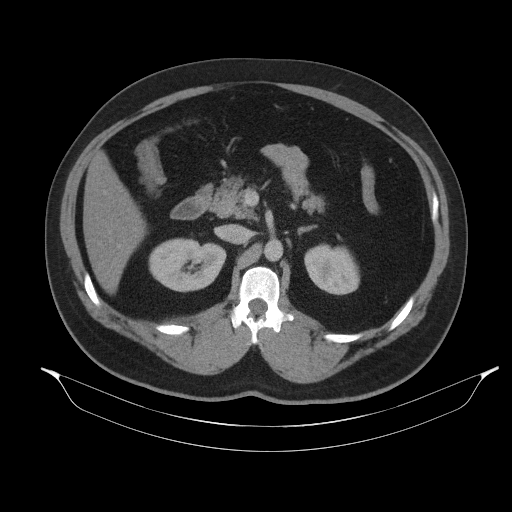
[im 69/109  bone]
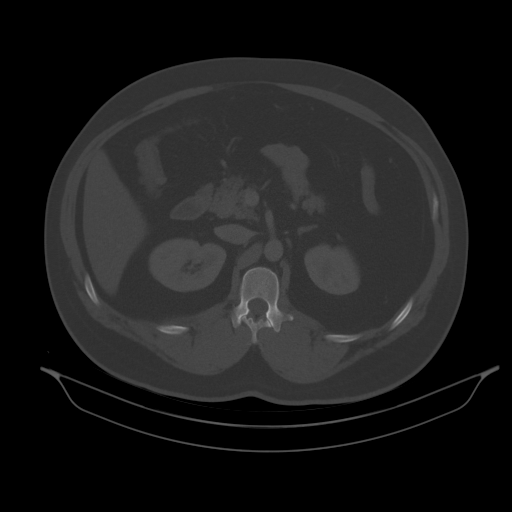
[im 80/109  soft-tissue]
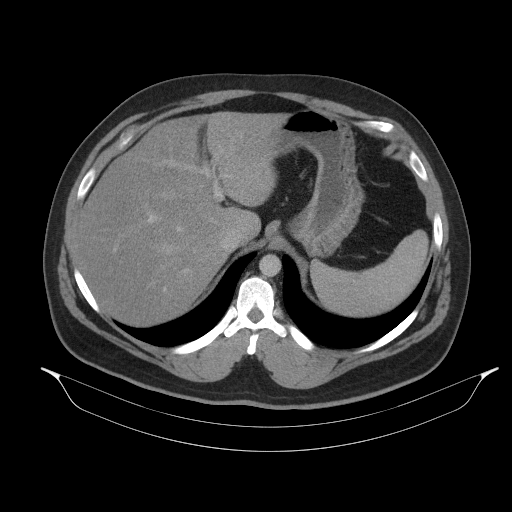
[im 86/109  soft-tissue]
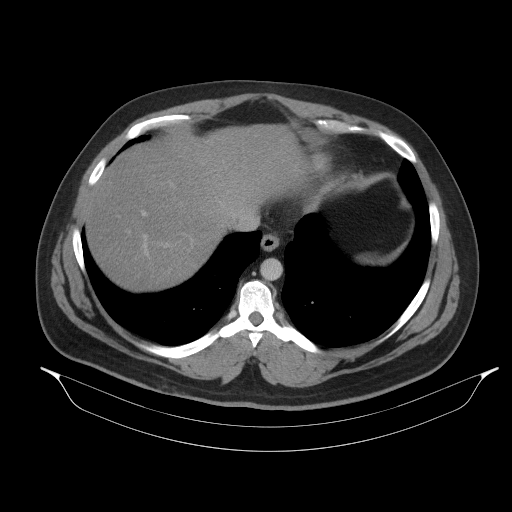
[im 91/109  soft-tissue]
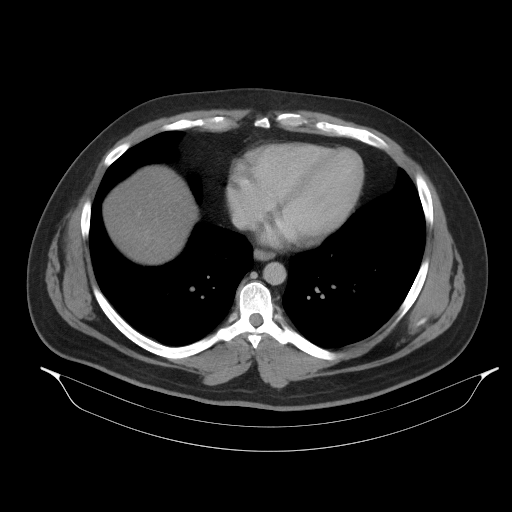
[im 103/109  soft-tissue]
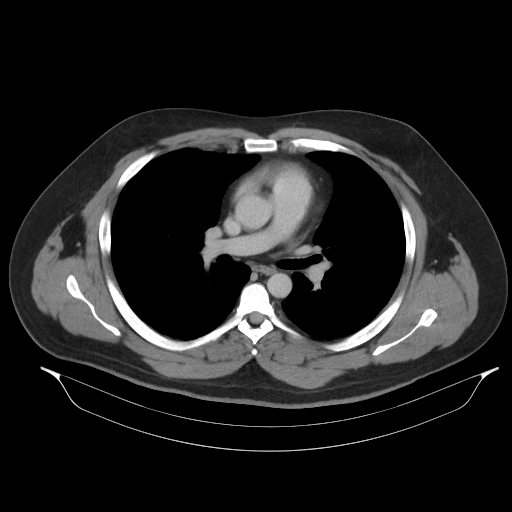

[Series 6: abdroutine 2.0 mpr cor · coronal · 0.89mm/px · 3 of 177 slices shown]
[im 59/177  soft-tissue]
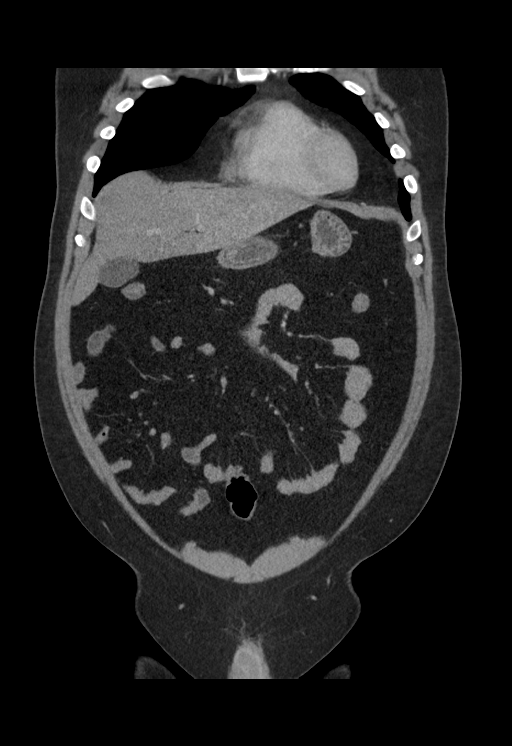
[im 79/177  soft-tissue]
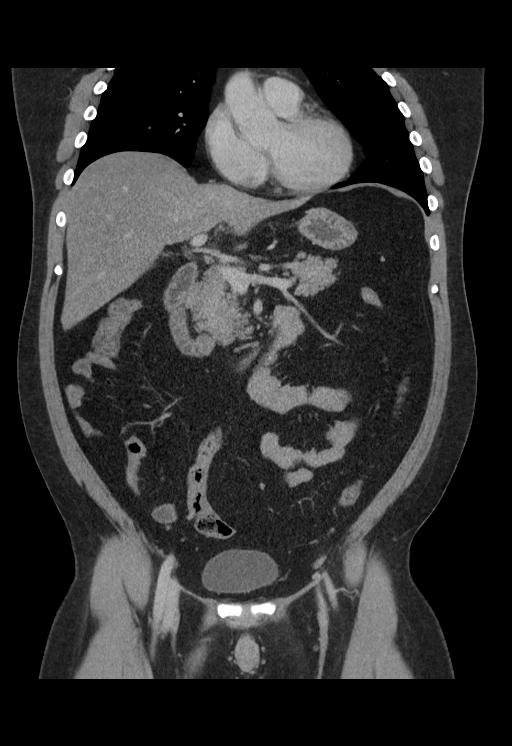
[im 98/177  soft-tissue]
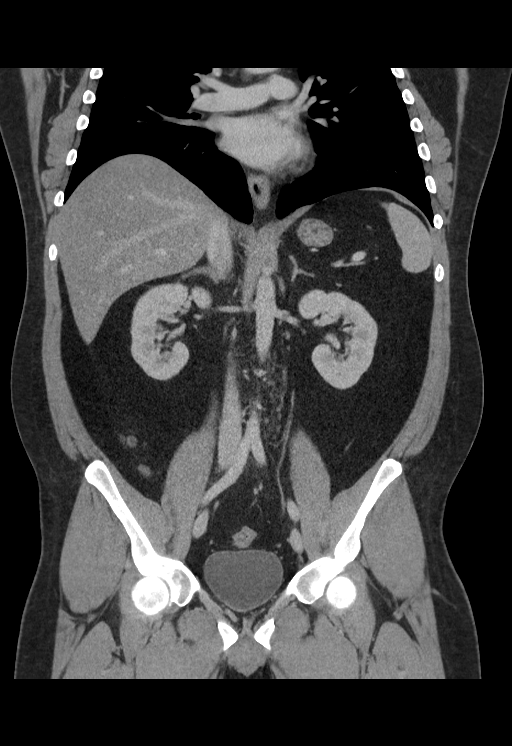

[16 of 46 positions shown; findings below may reference images not displayed]

FINDINGS: Lower chest: No acute abnormality.

Hepatobiliary: Diffuse low attenuation of the liver as can be seen
with hepatic steatosis. No focal liver abnormality is seen. No
gallstones, gallbladder wall thickening, or biliary dilatation.

Pancreas: Unremarkable. No pancreatic ductal dilatation or
surrounding inflammatory changes.

Spleen: Normal in size without focal abnormality.

Adrenals/Urinary Tract: Adrenal glands are unremarkable. Kidneys are
normal, without renal calculi, focal lesion, or hydronephrosis.
Bladder is unremarkable.

Stomach/Bowel: Stomach is within normal limits. Appendix appears
normal. No evidence of bowel wall thickening, distention, or
inflammatory changes.

Vascular/Lymphatic: No significant vascular findings are present. No
enlarged abdominal or pelvic lymph nodes.

Reproductive: Prostate is unremarkable.

Other: Small fat containing umbilical hernia. No abdominopelvic
ascites.

Musculoskeletal: No acute or significant osseous findings.
IMPRESSION: 1. No acute abdominal or pelvic pathology.
2. Hepatic steatosis.

## 2018-12-20 ENCOUNTER — Telehealth: Payer: Self-pay | Admitting: Cardiology

## 2018-12-20 NOTE — Telephone Encounter (Signed)
Pt's last OV 04-2017. Returned call to pt pt states that he does not want to come into the d/t COVID. He states that he would like to cancel appt. I will forward to PJ's nurse to update before cancelling.

## 2018-12-20 NOTE — Telephone Encounter (Signed)
Please advise if okay to switch. Thank you!

## 2018-12-20 NOTE — Telephone Encounter (Signed)
Spoke to patient appointment 8/26 changed to a virtual visit.

## 2018-12-20 NOTE — Telephone Encounter (Signed)
New Message   Patient is calling in reference to his appointment scheduled for tomorrow 12/20/18 at 8:20 with Dr. Martinique and would like to make it a virtual visit and do the EKG at a later day. Please give patient a call back to confirm if the change is able to be made.

## 2018-12-20 NOTE — Progress Notes (Deleted)
Cardiology Office Note    Date:  12/20/2018   ID:  Charles Hess, DOB 08-01-1980, MRN [id number]  PCP:  Charles PennaHolwerda, Scott, MD  Cardiologist:  Charles Yoshino SwazilandJordan MD  No chief complaint on file.   History of Present Illness:  Charles Hess is a 38 y.o. male seen for follow up HTN.  He has a remote history of palpitations.   Heart monitor was reportedly normal.  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   He is a Chartered loss adjusternetwork security officer.  In 2018 he had elevated blood pressure. His baseline blood pressure has been read in the 140s to 150s range, but with anxiety episode his systolic blood pressure can go up to 180s. He was initially tried on low dose Toprol XL but could not tolerate this due to abdominal pain. Was switched to amlodipine which he is tolerating well. He had a normal ETT in January 2019.   On follow up today he does note some chest discomfort on days when he misses taking his amlodipine. Denies chest pain at other times. He is walking 30 minutes 3 days a week. Weight down 2 lbs.    Past Medical History:  Diagnosis Date  . Dizziness   . Hypertension   . Palpitations     Past Surgical History:  Procedure Laterality Date  . none      Current Medications: Outpatient Medications Prior to Visit  Medication Sig Dispense Refill  . amLODipine (NORVASC) 5 MG tablet Take 1 tablet (5 mg total) by mouth daily. 90 tablet 3   No facility-administered medications prior to visit.      Allergies:   Patient has no known allergies.   Social History   Socioeconomic History  . Marital status: Married    Spouse name: Not on file  . Number of children: 1  . Years of education: Not on file  . Highest education level: Not on file  Occupational History  . Occupation: Charity fundraiserinternet security  Social Needs  . Financial resource strain: Not on file  . Food insecurity    Worry: Not on file    Inability: Not on file  .  Transportation needs    Medical: Not on file    Non-medical: Not on file  Tobacco Use  . Smoking status: Never Smoker  . Smokeless tobacco: Never Used  Substance and Sexual Activity  . Alcohol use: Yes    Comment: twice weekly   . Drug use: No  . Sexual activity: Not on file  Lifestyle  . Physical activity    Days per week: Not on file    Minutes per session: Not on file  . Stress: Not on file  Relationships  . Social Musicianconnections    Talks on phone: Not on file    Gets together: Not on file    Attends religious service: Not on file    Active member of club or organization: Not on file    Attends meetings of clubs or organizations: Not on file    Relationship status: Not on file  Other Topics Concern  . Not on file  Social History Narrative  . Not on file     Family History:  The patient's family history includes Alzheimer's disease in his maternal grandfather and maternal grandmother; Diabetes in his maternal grandfather; Healthy in his brother and brother; Heart disease in his father and paternal grandfather; Hypertension in his father; Prostate cancer  in his maternal grandfather.   ROS:   Please see the history of present illness.    ROS All other systems reviewed and are negative.   PHYSICAL EXAM:   VS:  There were no vitals taken for this visit.   GENERAL:  Well appearing, obese WM in NAD HEENT:  PERRL, EOMI, sclera are clear. Oropharynx is clear. NECK:  No jugular venous distention, carotid upstroke brisk and symmetric, no bruits, no thyromegaly or adenopathy LUNGS:  Clear to auscultation bilaterally CHEST:  Unremarkable HEART:  RRR,  PMI not displaced or sustained,S1 and S2 within normal limits, no S3, no S4: no clicks, no rubs, no murmurs ABD:  Soft, nontender. BS +, no masses or bruits. No hepatomegaly, no splenomegaly EXT:  2 + pulses throughout, no edema, no cyanosis no clubbing SKIN:  Warm and dry.  No rashes NEURO:  Alert and oriented x 3. Cranial nerves II  through XII intact. PSYCH:  Cognitively intact    Wt Readings from Last 3 Encounters:  05/17/17 240 lb 12.8 oz (109.2 kg)  11/03/16 242 lb (109.8 kg)  08/28/16 240 lb (108.9 kg)      Studies/Labs Reviewed:   EKG:  EKG is not ordered today.    Recent Labs: No results found for requested labs within last 8760 hours.   Lipid Panel    Component Value Date/Time   CHOL 148 05/17/2017 0828   TRIG 72 05/17/2017 0828   HDL 42 05/17/2017 0828   LDLCALC 92 05/17/2017 0828    Additional studies/ records that were reviewed today include:  Labs dated 10/23/09: cholesterol 162, triglycerides 125, HDL 41, LDL 96 Dated 11/16/18: cholesterol 138, triglycerides 87, HDL 36, LDL 85. CBC, CMET, and TSH normal   ETT 05/25/17: Study Highlights    There was no ST segment deviation noted during stress.  Blood pressure demonstrated a normal response to exercise.   Normal exercise treadmill stress test, no ischemia. Excellent exercise capacity. Normal blood pressure response to exercise.      ASSESSMENT:    No diagnosis found.   PLAN:  In order of problems listed above:  1. Hypertension: BP control well controlled on amlodipine. Tolerating well. Counseled on need for continued lifestyle modification with weight loss and increased aerobic activity.  2. Chest pain. Recommend ETT. Will also check lipids today.    Medication Adjustments/Labs and Tests Ordered: Current medicines are reviewed at length with the patient today.  Concerns regarding medicines are outlined above.  Medication changes, Labs and Tests ordered today are listed in the Patient Instructions below. There are no Patient Instructions on file for this visit.   Signed, Berdene Askari Martinique, MD  12/20/2018 7:33 AM    [facility] [address], [place], [place]  [address] Phone: [phone]; Fax: [phone]

## 2018-12-21 ENCOUNTER — Other Ambulatory Visit: Payer: Self-pay

## 2018-12-21 ENCOUNTER — Ambulatory Visit: Payer: BLUE CROSS/BLUE SHIELD | Admitting: Cardiology

## 2018-12-21 ENCOUNTER — Encounter: Payer: Self-pay | Admitting: Cardiology

## 2018-12-21 ENCOUNTER — Telehealth (INDEPENDENT_AMBULATORY_CARE_PROVIDER_SITE_OTHER): Payer: BLUE CROSS/BLUE SHIELD | Admitting: Cardiology

## 2018-12-21 VITALS — BP 124/80 | HR 76 | Ht 69.0 in | Wt 228.0 lb

## 2018-12-21 DIAGNOSIS — I10 Essential (primary) hypertension: Secondary | ICD-10-CM | POA: Diagnosis not present

## 2018-12-21 DIAGNOSIS — R079 Chest pain, unspecified: Secondary | ICD-10-CM

## 2018-12-21 NOTE — Patient Instructions (Signed)
Medication Instructions:  Continue same medication   Lab work: None ordered   Testing/Procedures: None ordered  Follow-Up: At [facility], you and your health needs are our priority.  As part of our continuing mission to provide you with exceptional heart care, we have created designated Provider Care Teams.  These Care Teams include your primary Cardiologist (physician) and Advanced Practice Providers (APPs -  Physician Assistants and Nurse Practitioners) who all work together to provide you with the care you need, when you need it. . Follow up as needed

## 2018-12-21 NOTE — Progress Notes (Signed)
Virtual Visit via Video Note   This visit type was conducted due to national recommendations for restrictions regarding the COVID-19 Pandemic (e.g. social distancing) in an effort to limit this patient's exposure and mitigate transmission in our community.  Due to his co-morbid illnesses, this patient is at least at moderate risk for complications without adequate follow up.  This format is felt to be most appropriate for this patient at this time.  All issues noted in this document were discussed and addressed.  A limited physical exam was performed with this format.  Please refer to the patient's chart for his consent to telehealth for [facility].   Date:  12/21/2018   ID:  [name], DOB 1981/04/16, MRN [id number]  Patient Location: Home Provider Location: Office  PCP:  Velna Hatchet, MD  Cardiologist:   Martinique, MD  Electrophysiologist:  None   Evaluation Performed:  Follow-Up Visit  Chief Complaint:  Follow up HTN  History of Present Illness:    Charles Hess is a 38 y.o. male with He has a remote history of palpitations.   Heart monitor was reportedly normal.  His wife Hinton Dyer used to work for [employer]- now works with [employer] in Radio broadcast assistant.   He is a Engineer, manufacturing systems. Noted in 2018 having elevated blood pressure. His baseline blood pressure has been read in the 140s to 150s range, but with anxiety episode his systolic blood pressure can go up to 180s. He was initially tried on low dose Toprol XL but could not tolerate this due to abdominal pain. Was switched to amlodipine which he is tolerating well.   Due to chest pain he had an ETT in 2019 which was normal.   On follow up he is doing well. Has consciously lost 22 lbs since March. Eating better. Exercising regularly. Working from home. Has no recurrent chest pain.  The patient does not have symptoms concerning for COVID-19 infection (fever, chills, cough, or new shortness of  breath).    Past Medical History:  Diagnosis Date  . Dizziness   . Hypertension   . Palpitations    Past Surgical History:  Procedure Laterality Date  . none       No outpatient medications have been marked as taking for the 12/21/18 encounter (Appointment) with Martinique,  M, MD.     Allergies:   Patient has no known allergies.   Social History   Tobacco Use  . Smoking status: Never Smoker  . Smokeless tobacco: Never Used  Substance Use Topics  . Alcohol use: Yes    Comment: twice weekly   . Drug use: No     Family Hx: The patient's family history includes Alzheimer's disease in his maternal grandfather and maternal grandmother; Diabetes in his maternal grandfather; Healthy in his brother and brother; Heart disease in his father and paternal grandfather; Hypertension in his father; Prostate cancer in his maternal grandfather. There is no history of Colon cancer, Stomach cancer, Liver cancer, Esophageal cancer, or Rectal cancer.  ROS:   Please see the history of present illness.    All other systems reviewed and are negative.   Prior CV studies:   The following studies were reviewed today:  ETT 05/25/17: Study Highlights    There was no ST segment deviation noted during stress.  Blood pressure demonstrated a normal response to exercise.   Normal exercise treadmill stress test, no ischemia. Excellent exercise capacity. Normal blood pressure response to exercise.  Labs/Other Tests and Data Reviewed:    EKG:  No ECG reviewed.  Recent Labs: No results found for requested labs within last 8760 hours.   Recent Lipid Panel Lab Results  Component Value Date/Time   CHOL 148 05/17/2017 08:28 AM   TRIG 72 05/17/2017 08:28 AM   HDL 42 05/17/2017 08:28 AM   LDLCALC 92 05/17/2017 08:28 AM   Labs dated 11/16/18: cholesterol 138, triglycerides 87, HDL 36, LDL 85. CBC, CMET, TSH normal  Wt Readings from Last 3 Encounters:  05/17/17 240 lb 12.8 oz (109.2 kg)   11/03/16 242 lb (109.8 kg)  08/28/16 240 lb (108.9 kg)     Objective:    Vital Signs:  There were no vitals taken for this visit.   VITAL SIGNS:  reviewed  ASSESSMENT & PLAN:    1. Hypertension: BP control well controlled on amlodipine. Tolerating well. Cntinued lifestyle modification with weight loss and increased aerobic activity.  2. Chest pain. ETT negative one year ago. No recurrence   COVID-19 Education: The signs and symptoms of COVID-19 were discussed with the patient and how to seek care for testing (follow up with PCP or arrange E-visit).  The importance of social distancing was discussed today.  Time:   Today, I have spent 10 minutes with the patient with telehealth technology discussing the above problems.     Medication Adjustments/Labs and Tests Ordered: Current medicines are reviewed at length with the patient today.  Concerns regarding medicines are outlined above.   Tests Ordered: No orders of the defined types were placed in this encounter.   Medication Changes: No orders of the defined types were placed in this encounter.   Follow Up:  PRN  Signed,  [place], MD  12/21/2018 8:06 AM    [facility]

## 2020-07-30 ENCOUNTER — Other Ambulatory Visit: Payer: Self-pay | Admitting: Otolaryngology

## 2020-08-13 ENCOUNTER — Encounter (HOSPITAL_BASED_OUTPATIENT_CLINIC_OR_DEPARTMENT_OTHER): Admission: RE | Payer: Self-pay | Source: Home / Self Care

## 2020-08-13 ENCOUNTER — Ambulatory Visit (HOSPITAL_BASED_OUTPATIENT_CLINIC_OR_DEPARTMENT_OTHER): Admission: RE | Admit: 2020-08-13 | Payer: BC Managed Care – PPO | Source: Home / Self Care | Admitting: Otolaryngology

## 2020-08-13 SURGERY — EXCISION, MASS, NOSE
Anesthesia: General | Laterality: Right

## 2021-09-03 ENCOUNTER — Other Ambulatory Visit (HOSPITAL_COMMUNITY): Payer: Self-pay

## 2021-09-03 MED ORDER — PREDNISONE 10 MG PO TABS
ORAL_TABLET | ORAL | 0 refills | Status: AC
  Filled 2021-09-03: qty 35, 15d supply, fill #0

## 2021-10-07 ENCOUNTER — Ambulatory Visit (HOSPITAL_BASED_OUTPATIENT_CLINIC_OR_DEPARTMENT_OTHER): Payer: BC Managed Care – PPO | Admitting: Family Medicine
